# Patient Record
Sex: Female | Born: 1935
Health system: Southern US, Community
[De-identification: ages and names within clinical notes are randomized; demographics above are authoritative.]

---

## 1998-11-12 ENCOUNTER — Other Ambulatory Visit: Admission: RE | Admit: 1998-11-12 | Discharge: 1998-11-12 | Payer: Self-pay | Admitting: Obstetrics and Gynecology

## 1998-11-26 ENCOUNTER — Ambulatory Visit (HOSPITAL_COMMUNITY): Admission: RE | Admit: 1998-11-26 | Discharge: 1998-11-26 | Payer: Self-pay | Admitting: Obstetrics and Gynecology

## 1999-02-11 ENCOUNTER — Inpatient Hospital Stay (HOSPITAL_COMMUNITY): Admission: RE | Admit: 1999-02-11 | Discharge: 1999-02-12 | Payer: Self-pay | Admitting: Obstetrics and Gynecology

## 2002-01-11 ENCOUNTER — Other Ambulatory Visit: Admission: RE | Admit: 2002-01-11 | Discharge: 2002-01-11 | Payer: Self-pay | Admitting: Obstetrics and Gynecology

## 2003-01-17 ENCOUNTER — Other Ambulatory Visit: Admission: RE | Admit: 2003-01-17 | Discharge: 2003-01-17 | Payer: Self-pay | Admitting: Obstetrics and Gynecology

## 2009-03-06 IMAGING — US MAMMO-RUNI-US
1 series · 7 of 7 positions shown · non-contrast
Comparison: NONE

CLINICAL DATA: DENOVA.Olam RT(R)(M)(CT)   Diagnostic Mammogram.  
Question  developing subareolar density in the right breast noted 
on  screening mammogram dated 01/25/2008. 

RIGHT BREAST MAMMOGRAM ADDITIONAL VIEWS AND RIGHT BREAST 
ULTRASOUND

[Series 1: us breast · 0.05mm/px · 7 of 7 slices shown]
[im 1/7]
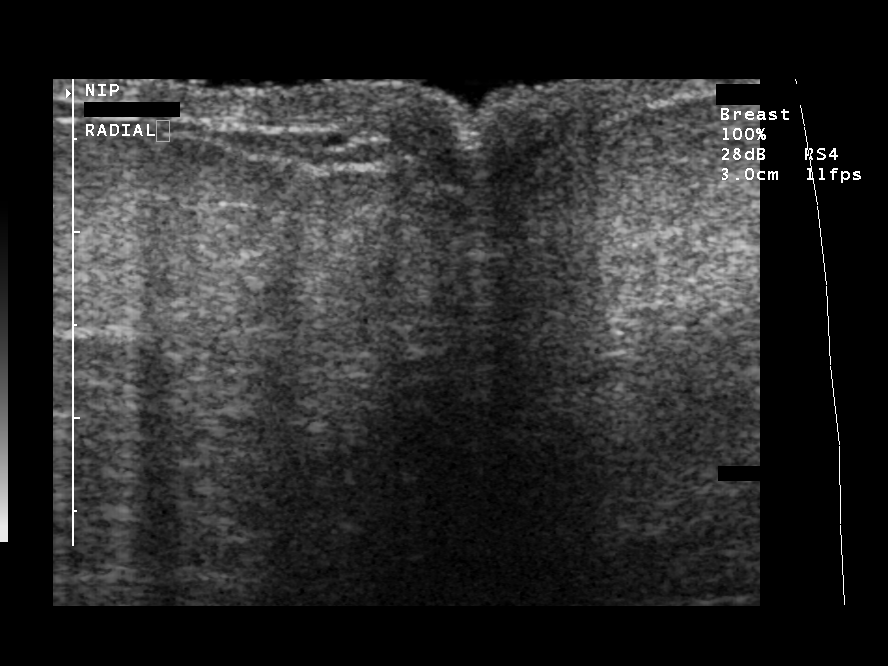
[im 2/7]
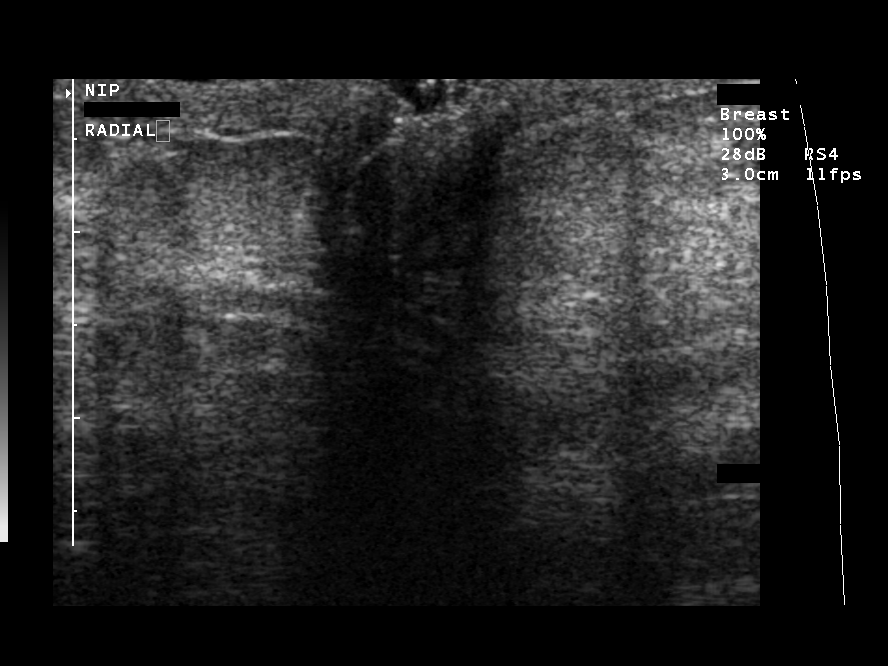
[im 3/7]
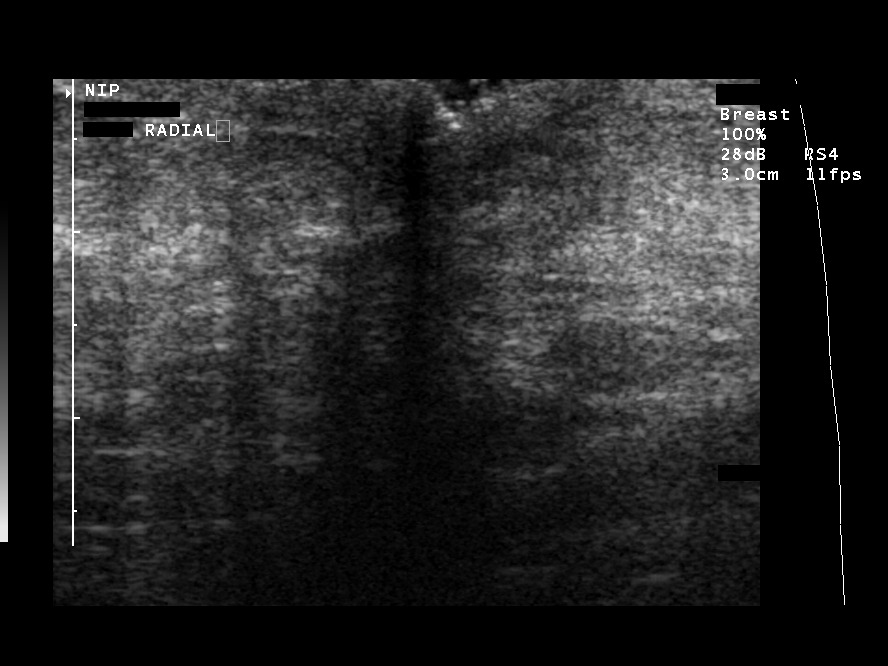
[im 4/7]
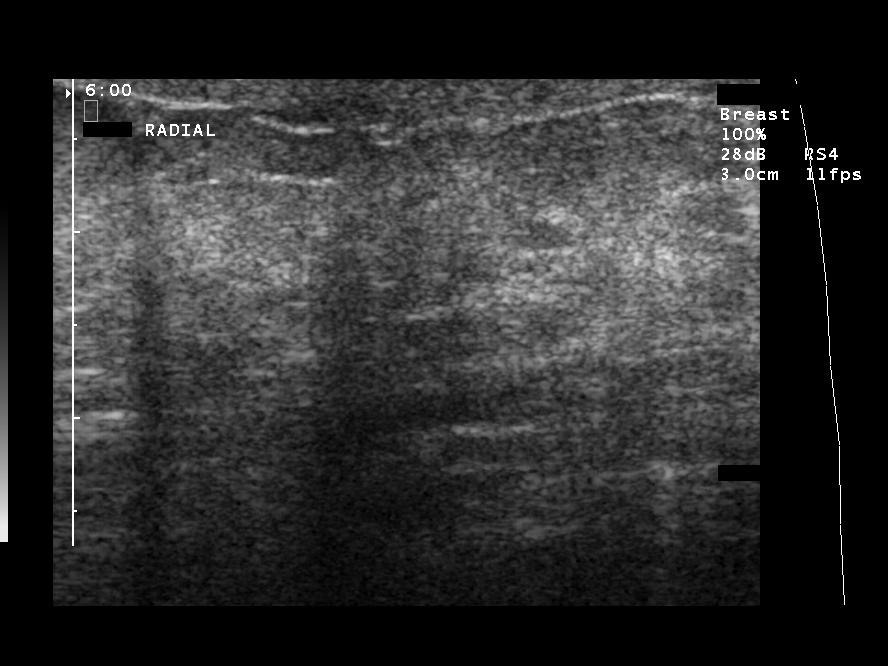
[im 5/7]
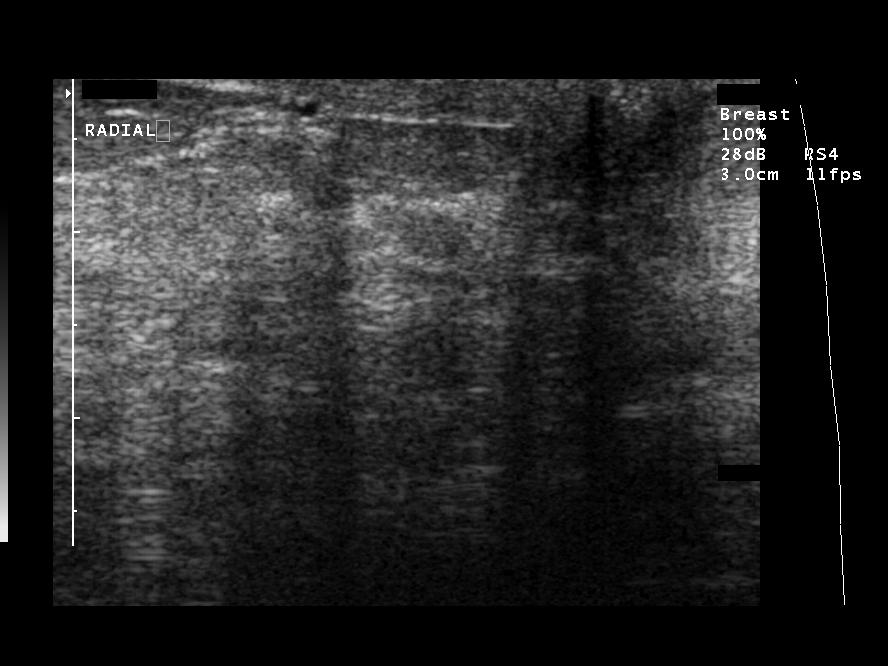
[im 6/7]
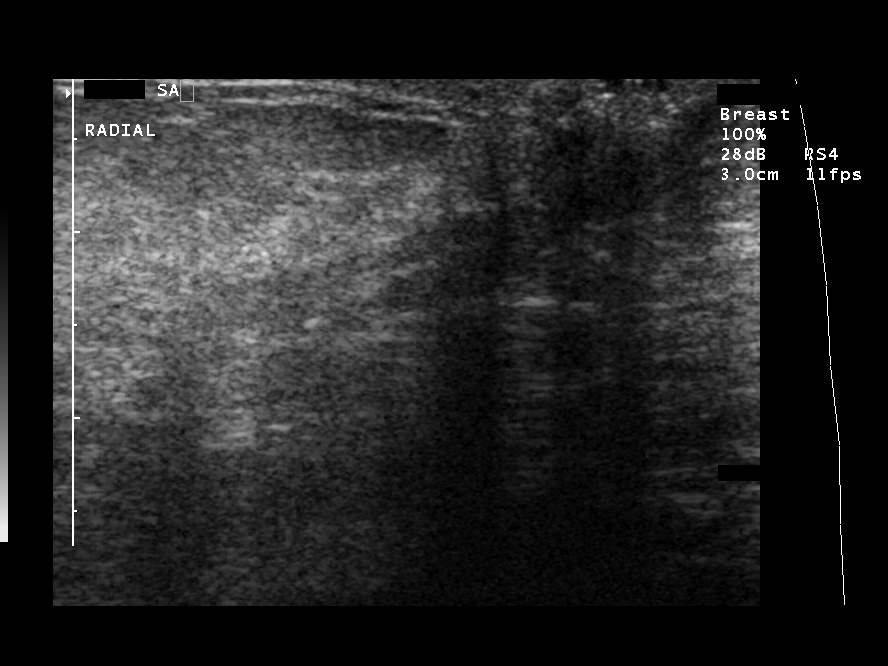
[im 7/7]
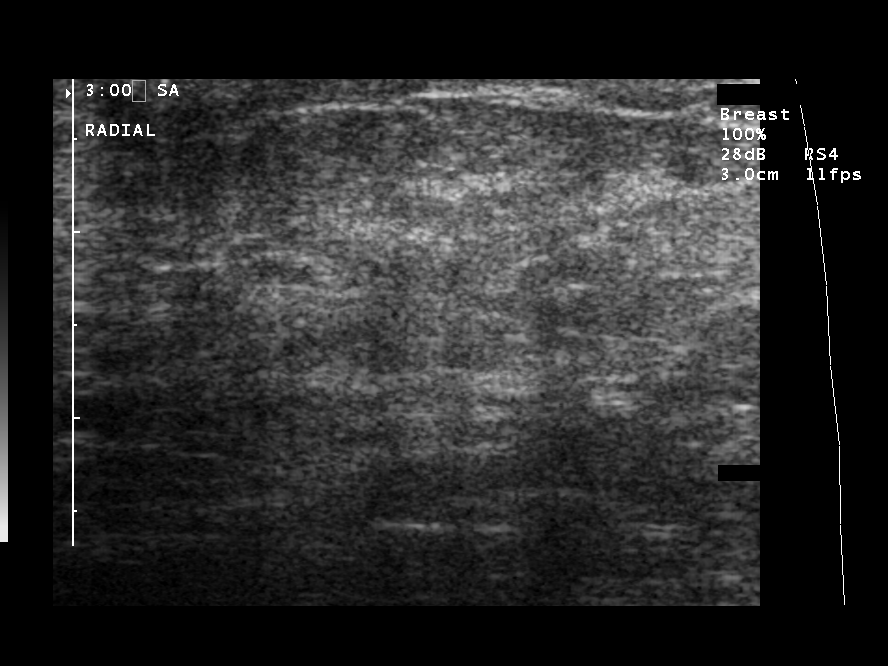

[7 of 7 positions shown; findings below may reference images not displayed]

FINDINGS: Spot compression view of the right breast in the CC 
and true lateral view demonstrates density likely associated with 
nipple inversion.  No architectural distortion, spiculation, mass 
or suspicious calcification.  Note is made of extensive scattered 
benign appearing calcifications in the right breast. Ultrasound 
demonstrates no cystic, solid or shadowing masses.
IMPRESSION: No mammographic or ultrasonographic finding 
suspicious for malignancy.  Bilateral mammogram in one year is 
recommended. The patient was informed at the time of the 
examination of the findings and recommendations by verbal and 
written lay report. Computer assisted (Second Look) technology was 
used as an aid in interpretation of this study. BI-RADS 2- Benign 
Date: 02/07/2008  Tran Date:  02/08/2008 INONK LAA

## 2013-09-18 ENCOUNTER — Other Ambulatory Visit: Payer: Self-pay | Admitting: Nephrology

## 2013-09-18 DIAGNOSIS — N189 Chronic kidney disease, unspecified: Secondary | ICD-10-CM

## 2013-09-25 ENCOUNTER — Ambulatory Visit
Admission: RE | Admit: 2013-09-25 | Discharge: 2013-09-25 | Disposition: A | Discharge status: Home / Self Care | Payer: Medicare Other | Source: Ambulatory Visit | Attending: Nephrology | Admitting: Nephrology

## 2013-09-25 DIAGNOSIS — N189 Chronic kidney disease, unspecified: Secondary | ICD-10-CM

## 2014-03-16 ENCOUNTER — Other Ambulatory Visit: Payer: Self-pay | Admitting: Obstetrics & Gynecology

## 2016-03-05 ENCOUNTER — Other Ambulatory Visit: Payer: Self-pay | Admitting: Family Medicine

## 2016-03-05 DIAGNOSIS — R0989 Other specified symptoms and signs involving the circulatory and respiratory systems: Secondary | ICD-10-CM

## 2016-03-11 ENCOUNTER — Ambulatory Visit
Admission: RE | Admit: 2016-03-11 | Discharge: 2016-03-11 | Disposition: A | Discharge status: Home / Self Care | Payer: Medicare Other | Source: Ambulatory Visit | Attending: Family Medicine | Admitting: Family Medicine

## 2016-03-11 DIAGNOSIS — R0989 Other specified symptoms and signs involving the circulatory and respiratory systems: Secondary | ICD-10-CM

## 2017-10-28 DIAGNOSIS — L900 Lichen sclerosus et atrophicus: Secondary | ICD-10-CM | POA: Diagnosis not present

## 2017-11-11 DIAGNOSIS — Z08 Encounter for follow-up examination after completed treatment for malignant neoplasm: Secondary | ICD-10-CM | POA: Diagnosis not present

## 2017-11-11 DIAGNOSIS — D1801 Hemangioma of skin and subcutaneous tissue: Secondary | ICD-10-CM | POA: Diagnosis not present

## 2017-11-11 DIAGNOSIS — Z85828 Personal history of other malignant neoplasm of skin: Secondary | ICD-10-CM | POA: Diagnosis not present

## 2017-12-03 ENCOUNTER — Other Ambulatory Visit: Payer: Self-pay | Admitting: Family Medicine

## 2017-12-03 DIAGNOSIS — R9389 Abnormal findings on diagnostic imaging of other specified body structures: Secondary | ICD-10-CM

## 2017-12-09 ENCOUNTER — Other Ambulatory Visit: Payer: Medicare Other

## 2017-12-10 ENCOUNTER — Ambulatory Visit
Admission: RE | Admit: 2017-12-10 | Discharge: 2017-12-10 | Disposition: A | Discharge status: Home / Self Care | Payer: Medicare Other | Source: Ambulatory Visit | Attending: Family Medicine | Admitting: Family Medicine

## 2017-12-10 DIAGNOSIS — I6523 Occlusion and stenosis of bilateral carotid arteries: Secondary | ICD-10-CM | POA: Diagnosis not present

## 2017-12-10 DIAGNOSIS — R9389 Abnormal findings on diagnostic imaging of other specified body structures: Secondary | ICD-10-CM

## 2018-02-25 DIAGNOSIS — Z6831 Body mass index (BMI) 31.0-31.9, adult: Secondary | ICD-10-CM | POA: Diagnosis not present

## 2018-02-25 DIAGNOSIS — I129 Hypertensive chronic kidney disease with stage 1 through stage 4 chronic kidney disease, or unspecified chronic kidney disease: Secondary | ICD-10-CM | POA: Diagnosis not present

## 2018-02-25 DIAGNOSIS — N183 Chronic kidney disease, stage 3 (moderate): Secondary | ICD-10-CM | POA: Diagnosis not present

## 2018-02-25 DIAGNOSIS — R809 Proteinuria, unspecified: Secondary | ICD-10-CM | POA: Diagnosis not present

## 2018-03-04 IMAGING — US US CAROTID DUPLEX BILAT
1 series · 13 of 24 positions shown · non-contrast
Comparison: 03/11/2016

CLINICAL DATA: 89-year-old female with a history of
atherosclerosis.

Cardiovascular risk factors include hypertension, hyperlipidemia,
diabetes
EXAM:
BILATERAL CAROTID DUPLEX ULTRASOUND
TECHNIQUE: Gray scale imaging, color Doppler and duplex ultrasound were
performed of bilateral carotid and vertebral arteries in the neck.

[Series 1: us carotid duplex bilat · 0.06mm/px · 13 of 46 slices shown]
[im 1/46]
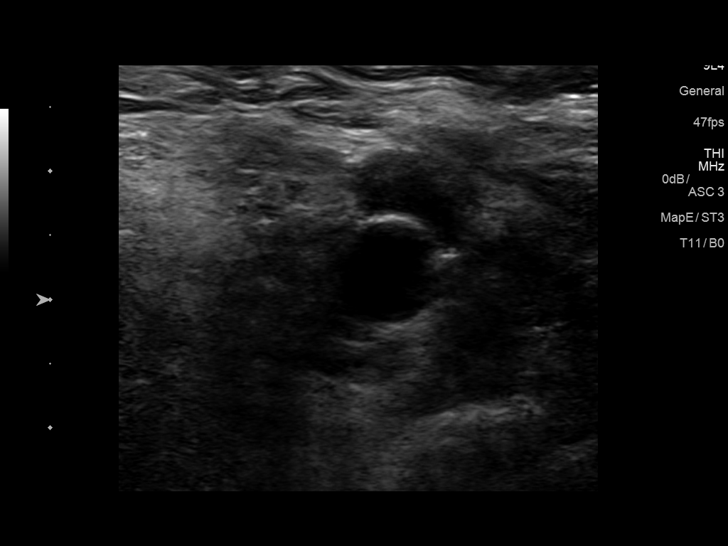
[im 4/46]
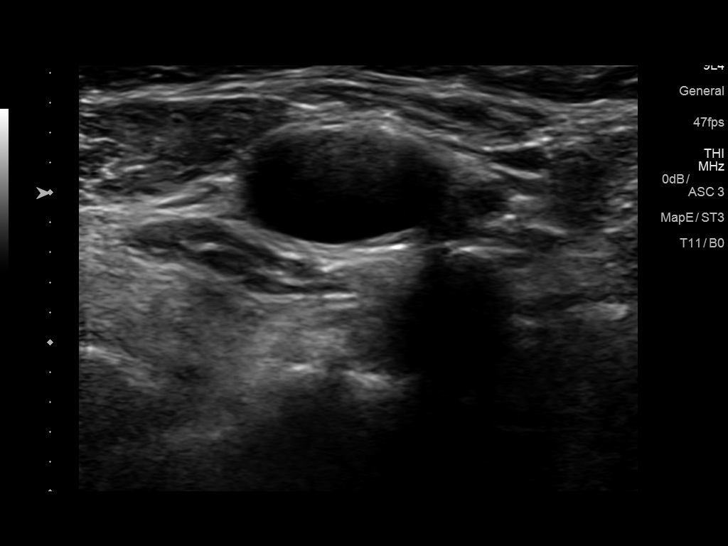
[im 8/46]
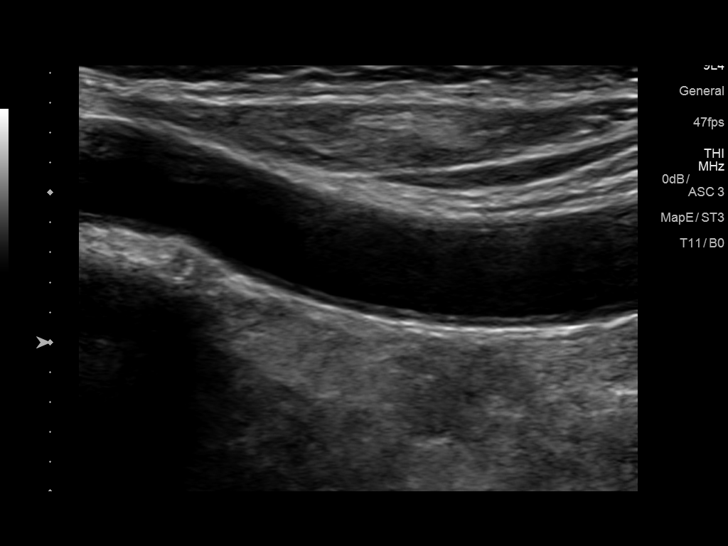
[im 12/46]
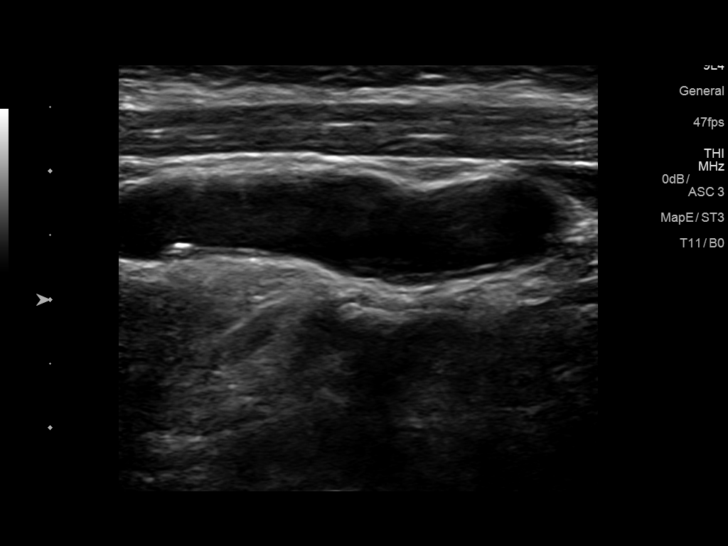
[im 16/46]
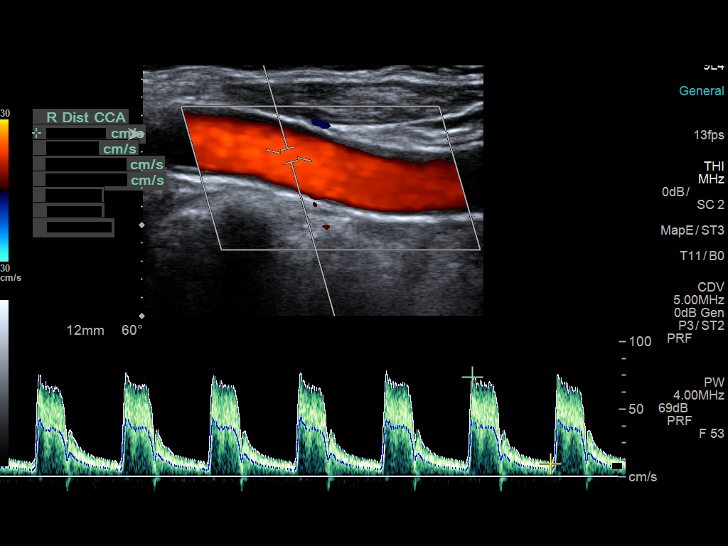
[im 20/46]
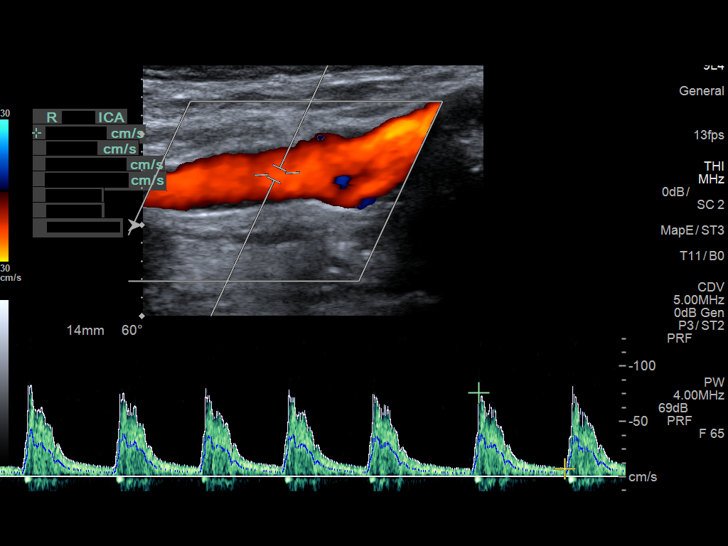
[im 24/46]
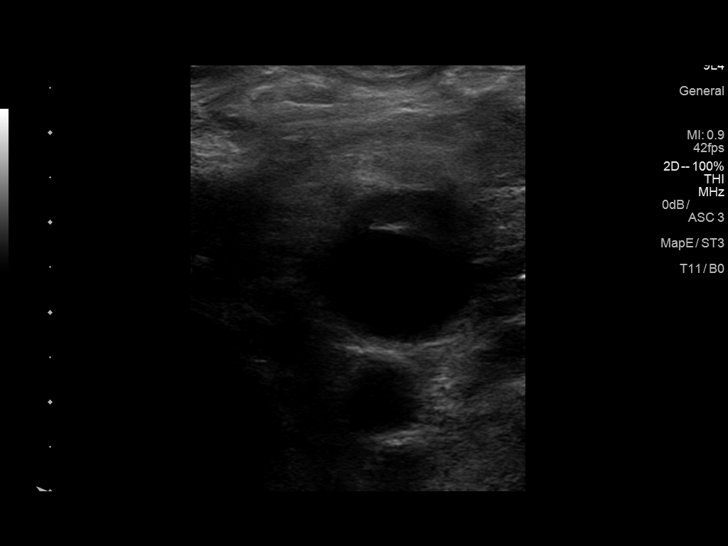
[im 26/46]
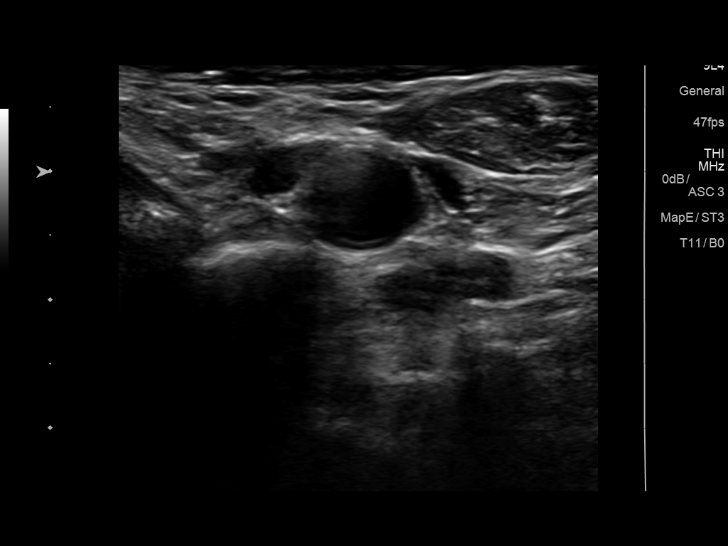
[im 30/46]
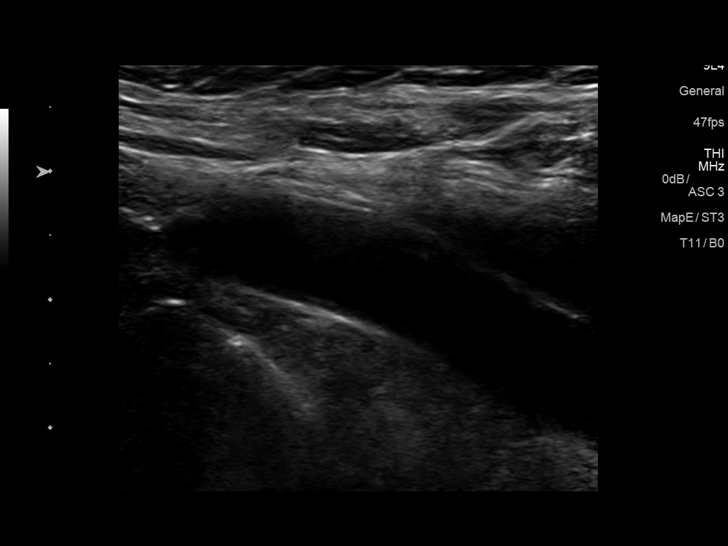
[im 34/46]
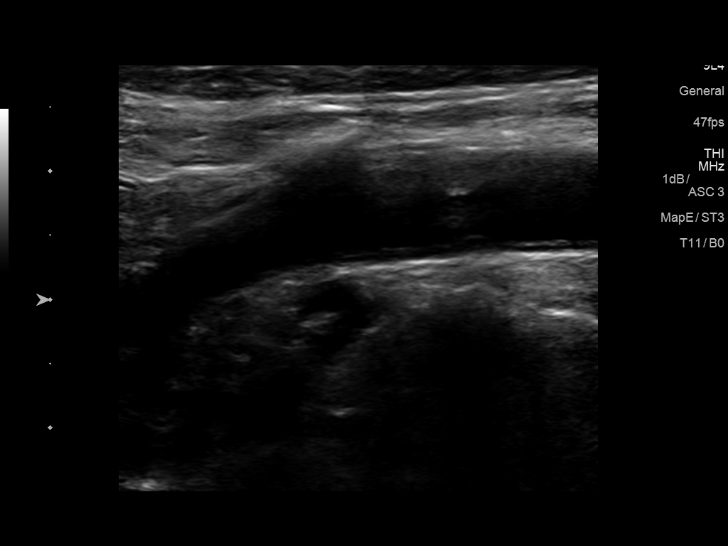
[im 38/46]
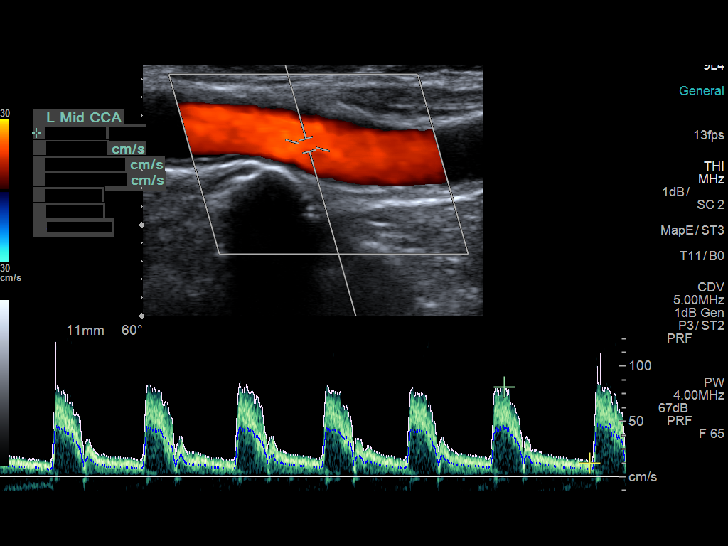
[im 42/46]
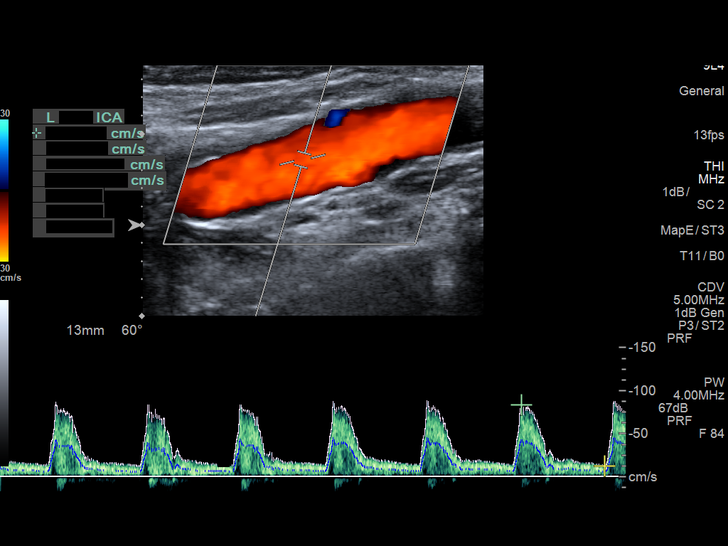
[im 46/46]
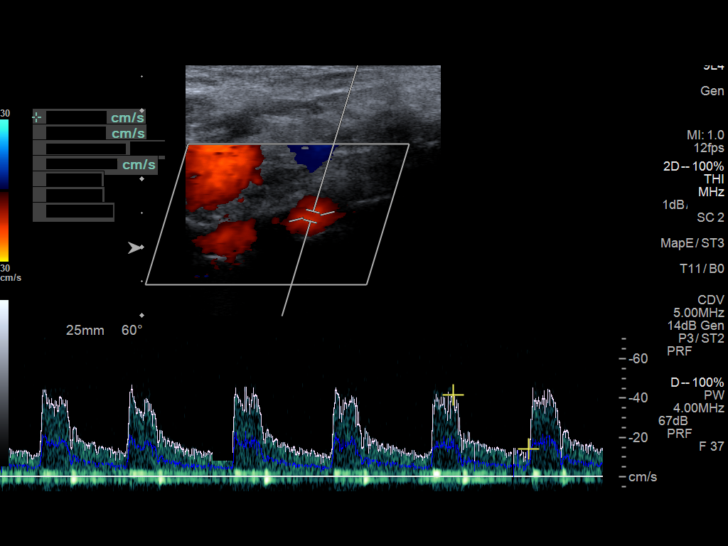

[13 of 24 positions shown; findings below may reference images not displayed]

FINDINGS: Criteria: Quantification of carotid stenosis is based on velocity
parameters that correlate the residual internal carotid diameter
with NASCET-based stenosis levels, using the diameter of the distal
internal carotid lumen as the denominator for stenosis measurement.

The following velocity measurements were obtained:

RIGHT

ICA:  Systolic 123 cm/sec, Diastolic 17 cm/sec

CCA:  74 cm/sec

SYSTOLIC ICA/CCA RATIO:

ECA:  96 cm/sec

LEFT

ICA:  Systolic 87 cm/sec, Diastolic 21 cm/sec

CCA:  100 cm/sec

SYSTOLIC ICA/CCA RATIO:

ECA:  100 cm/sec

Right Brachial SBP: 195

Left Brachial SBP: Not acquired

RIGHT CAROTID ARTERY: No significant calcifications of the right
common carotid artery. Intermediate waveform maintained.
Heterogeneous and partially calcified plaque at the right carotid
bifurcation. No significant lumen shadowing. Low resistance waveform
of the right ICA. No significant tortuosity.

RIGHT VERTEBRAL ARTERY: Antegrade flow with low resistance waveform.

LEFT CAROTID ARTERY: No significant calcifications of the left
common carotid artery. Intermediate waveform maintained.
Heterogeneous and partially calcified plaque at the left carotid
bifurcation without significant lumen shadowing. Low resistance
waveform of the left ICA. No significant tortuosity.

LEFT VERTEBRAL ARTERY:  Antegrade flow with low resistance waveform.
IMPRESSION: Color duplex indicates minimal heterogeneous and calcified plaque,
with no hemodynamically significant stenosis by duplex criteria in
the extracranial cerebrovascular circulation.

## 2018-03-22 DIAGNOSIS — E119 Type 2 diabetes mellitus without complications: Secondary | ICD-10-CM | POA: Diagnosis not present

## 2018-03-22 DIAGNOSIS — Z853 Personal history of malignant neoplasm of breast: Secondary | ICD-10-CM | POA: Diagnosis not present

## 2018-03-22 DIAGNOSIS — Z1389 Encounter for screening for other disorder: Secondary | ICD-10-CM | POA: Diagnosis not present

## 2018-03-22 DIAGNOSIS — E039 Hypothyroidism, unspecified: Secondary | ICD-10-CM | POA: Diagnosis not present

## 2018-03-22 DIAGNOSIS — E782 Mixed hyperlipidemia: Secondary | ICD-10-CM | POA: Diagnosis not present

## 2018-03-22 DIAGNOSIS — Z0000 Encounter for general adult medical examination without abnormal findings: Secondary | ICD-10-CM | POA: Diagnosis not present

## 2018-03-22 DIAGNOSIS — I10 Essential (primary) hypertension: Secondary | ICD-10-CM | POA: Diagnosis not present

## 2018-03-22 DIAGNOSIS — E559 Vitamin D deficiency, unspecified: Secondary | ICD-10-CM | POA: Diagnosis not present

## 2018-03-22 DIAGNOSIS — E2839 Other primary ovarian failure: Secondary | ICD-10-CM | POA: Diagnosis not present

## 2018-03-22 DIAGNOSIS — Z9010 Acquired absence of unspecified breast and nipple: Secondary | ICD-10-CM | POA: Diagnosis not present

## 2018-03-22 DIAGNOSIS — N183 Chronic kidney disease, stage 3 (moderate): Secondary | ICD-10-CM | POA: Diagnosis not present

## 2018-03-22 DIAGNOSIS — R809 Proteinuria, unspecified: Secondary | ICD-10-CM | POA: Diagnosis not present

## 2018-03-29 DIAGNOSIS — Z7984 Long term (current) use of oral hypoglycemic drugs: Secondary | ICD-10-CM | POA: Diagnosis not present

## 2018-03-29 DIAGNOSIS — E119 Type 2 diabetes mellitus without complications: Secondary | ICD-10-CM | POA: Diagnosis not present

## 2018-04-04 DIAGNOSIS — E119 Type 2 diabetes mellitus without complications: Secondary | ICD-10-CM | POA: Diagnosis not present

## 2018-04-04 DIAGNOSIS — H353132 Nonexudative age-related macular degeneration, bilateral, intermediate dry stage: Secondary | ICD-10-CM | POA: Diagnosis not present

## 2018-05-03 DIAGNOSIS — M8588 Other specified disorders of bone density and structure, other site: Secondary | ICD-10-CM | POA: Diagnosis not present

## 2018-05-26 DIAGNOSIS — Z85828 Personal history of other malignant neoplasm of skin: Secondary | ICD-10-CM | POA: Diagnosis not present

## 2018-05-26 DIAGNOSIS — Z08 Encounter for follow-up examination after completed treatment for malignant neoplasm: Secondary | ICD-10-CM | POA: Diagnosis not present

## 2018-05-26 DIAGNOSIS — L570 Actinic keratosis: Secondary | ICD-10-CM | POA: Diagnosis not present

## 2018-05-26 DIAGNOSIS — X32XXXD Exposure to sunlight, subsequent encounter: Secondary | ICD-10-CM | POA: Diagnosis not present

## 2018-05-26 DIAGNOSIS — D225 Melanocytic nevi of trunk: Secondary | ICD-10-CM | POA: Diagnosis not present

## 2018-05-27 ENCOUNTER — Other Ambulatory Visit: Payer: Self-pay | Admitting: *Deleted

## 2018-05-27 NOTE — Patient Outreach (Signed)
[facility]) Care Management  05/26/2018  Stacy Valentine 09/23/1926 [id number]    RN attempted the initial outreach however unsuccessful. Will continue calls and send outreach letter accordingly for possible needs.  Raina Mina, RN Care Management Coordinator [place] Office [phone]

## 2018-06-08 ENCOUNTER — Other Ambulatory Visit: Payer: Self-pay | Admitting: *Deleted

## 2018-06-08 NOTE — Patient Outreach (Signed)
[facility]) Care Management  06/08/2018  Stacy Valentine [birth year] [id number]    HTA/HRA Closure  RN attempted outreach calls X three however unsuccessful with outreach letter. Case will be closed due to unsuccessful contacts.  Raina Mina, RN Care Management Coordinator [place] Office [phone]

## 2018-08-01 DIAGNOSIS — Z23 Encounter for immunization: Secondary | ICD-10-CM | POA: Diagnosis not present

## 2018-09-26 DIAGNOSIS — E11319 Type 2 diabetes mellitus with unspecified diabetic retinopathy without macular edema: Secondary | ICD-10-CM | POA: Diagnosis not present

## 2018-09-26 DIAGNOSIS — E039 Hypothyroidism, unspecified: Secondary | ICD-10-CM | POA: Diagnosis not present

## 2018-09-26 DIAGNOSIS — I10 Essential (primary) hypertension: Secondary | ICD-10-CM | POA: Diagnosis not present

## 2018-09-26 DIAGNOSIS — N183 Chronic kidney disease, stage 3 (moderate): Secondary | ICD-10-CM | POA: Diagnosis not present

## 2018-09-26 DIAGNOSIS — E782 Mixed hyperlipidemia: Secondary | ICD-10-CM | POA: Diagnosis not present

## 2018-09-26 DIAGNOSIS — R809 Proteinuria, unspecified: Secondary | ICD-10-CM | POA: Diagnosis not present

## 2018-09-26 DIAGNOSIS — Z7984 Long term (current) use of oral hypoglycemic drugs: Secondary | ICD-10-CM | POA: Diagnosis not present

## 2018-09-26 DIAGNOSIS — E559 Vitamin D deficiency, unspecified: Secondary | ICD-10-CM | POA: Diagnosis not present

## 2018-09-26 DIAGNOSIS — E1122 Type 2 diabetes mellitus with diabetic chronic kidney disease: Secondary | ICD-10-CM | POA: Diagnosis not present

## 2018-10-10 DIAGNOSIS — E878 Other disorders of electrolyte and fluid balance, not elsewhere classified: Secondary | ICD-10-CM | POA: Diagnosis not present

## 2018-11-22 DIAGNOSIS — I129 Hypertensive chronic kidney disease with stage 1 through stage 4 chronic kidney disease, or unspecified chronic kidney disease: Secondary | ICD-10-CM | POA: Diagnosis not present

## 2018-11-22 DIAGNOSIS — N183 Chronic kidney disease, stage 3 (moderate): Secondary | ICD-10-CM | POA: Diagnosis not present

## 2018-11-22 DIAGNOSIS — R809 Proteinuria, unspecified: Secondary | ICD-10-CM | POA: Diagnosis not present

## 2018-11-22 DIAGNOSIS — E1029 Type 1 diabetes mellitus with other diabetic kidney complication: Secondary | ICD-10-CM | POA: Diagnosis not present

## 2018-12-01 DIAGNOSIS — Z08 Encounter for follow-up examination after completed treatment for malignant neoplasm: Secondary | ICD-10-CM | POA: Diagnosis not present

## 2018-12-01 DIAGNOSIS — Z85828 Personal history of other malignant neoplasm of skin: Secondary | ICD-10-CM | POA: Diagnosis not present

## 2018-12-01 DIAGNOSIS — X32XXXD Exposure to sunlight, subsequent encounter: Secondary | ICD-10-CM | POA: Diagnosis not present

## 2018-12-01 DIAGNOSIS — L570 Actinic keratosis: Secondary | ICD-10-CM | POA: Diagnosis not present

## 2018-12-22 DIAGNOSIS — L900 Lichen sclerosus et atrophicus: Secondary | ICD-10-CM | POA: Diagnosis not present

## 2019-01-03 DIAGNOSIS — I10 Essential (primary) hypertension: Secondary | ICD-10-CM | POA: Diagnosis not present

## 2019-01-03 DIAGNOSIS — E11319 Type 2 diabetes mellitus with unspecified diabetic retinopathy without macular edema: Secondary | ICD-10-CM | POA: Diagnosis not present

## 2019-01-03 DIAGNOSIS — E782 Mixed hyperlipidemia: Secondary | ICD-10-CM | POA: Diagnosis not present

## 2019-01-03 DIAGNOSIS — N183 Chronic kidney disease, stage 3 (moderate): Secondary | ICD-10-CM | POA: Diagnosis not present

## 2019-01-03 DIAGNOSIS — Z853 Personal history of malignant neoplasm of breast: Secondary | ICD-10-CM | POA: Diagnosis not present

## 2019-01-03 DIAGNOSIS — E039 Hypothyroidism, unspecified: Secondary | ICD-10-CM | POA: Diagnosis not present

## 2019-01-03 DIAGNOSIS — E1122 Type 2 diabetes mellitus with diabetic chronic kidney disease: Secondary | ICD-10-CM | POA: Diagnosis not present

## 2019-03-31 DIAGNOSIS — E559 Vitamin D deficiency, unspecified: Secondary | ICD-10-CM | POA: Diagnosis not present

## 2019-03-31 DIAGNOSIS — N183 Chronic kidney disease, stage 3 (moderate): Secondary | ICD-10-CM | POA: Diagnosis not present

## 2019-03-31 DIAGNOSIS — E039 Hypothyroidism, unspecified: Secondary | ICD-10-CM | POA: Diagnosis not present

## 2019-03-31 DIAGNOSIS — E782 Mixed hyperlipidemia: Secondary | ICD-10-CM | POA: Diagnosis not present

## 2019-03-31 DIAGNOSIS — Z6828 Body mass index (BMI) 28.0-28.9, adult: Secondary | ICD-10-CM | POA: Diagnosis not present

## 2019-03-31 DIAGNOSIS — E1122 Type 2 diabetes mellitus with diabetic chronic kidney disease: Secondary | ICD-10-CM | POA: Diagnosis not present

## 2019-03-31 DIAGNOSIS — Z0000 Encounter for general adult medical examination without abnormal findings: Secondary | ICD-10-CM | POA: Diagnosis not present

## 2019-03-31 DIAGNOSIS — M25511 Pain in right shoulder: Secondary | ICD-10-CM | POA: Diagnosis not present

## 2019-04-07 DIAGNOSIS — R809 Proteinuria, unspecified: Secondary | ICD-10-CM | POA: Diagnosis not present

## 2019-04-07 DIAGNOSIS — R634 Abnormal weight loss: Secondary | ICD-10-CM | POA: Diagnosis not present

## 2019-04-07 DIAGNOSIS — I10 Essential (primary) hypertension: Secondary | ICD-10-CM | POA: Diagnosis not present

## 2019-04-07 DIAGNOSIS — E1122 Type 2 diabetes mellitus with diabetic chronic kidney disease: Secondary | ICD-10-CM | POA: Diagnosis not present

## 2019-04-07 DIAGNOSIS — E039 Hypothyroidism, unspecified: Secondary | ICD-10-CM | POA: Diagnosis not present

## 2019-04-07 DIAGNOSIS — N183 Chronic kidney disease, stage 3 (moderate): Secondary | ICD-10-CM | POA: Diagnosis not present

## 2019-04-07 DIAGNOSIS — E782 Mixed hyperlipidemia: Secondary | ICD-10-CM | POA: Diagnosis not present

## 2019-04-07 DIAGNOSIS — Z7984 Long term (current) use of oral hypoglycemic drugs: Secondary | ICD-10-CM | POA: Diagnosis not present

## 2019-07-12 DIAGNOSIS — N2581 Secondary hyperparathyroidism of renal origin: Secondary | ICD-10-CM | POA: Diagnosis not present

## 2019-07-12 DIAGNOSIS — N183 Chronic kidney disease, stage 3 (moderate): Secondary | ICD-10-CM | POA: Diagnosis not present

## 2019-07-12 DIAGNOSIS — E1029 Type 1 diabetes mellitus with other diabetic kidney complication: Secondary | ICD-10-CM | POA: Diagnosis not present

## 2019-07-12 DIAGNOSIS — I129 Hypertensive chronic kidney disease with stage 1 through stage 4 chronic kidney disease, or unspecified chronic kidney disease: Secondary | ICD-10-CM | POA: Diagnosis not present

## 2019-07-12 DIAGNOSIS — R809 Proteinuria, unspecified: Secondary | ICD-10-CM | POA: Diagnosis not present

## 2019-07-13 DIAGNOSIS — Z85828 Personal history of other malignant neoplasm of skin: Secondary | ICD-10-CM | POA: Diagnosis not present

## 2019-07-13 DIAGNOSIS — Z08 Encounter for follow-up examination after completed treatment for malignant neoplasm: Secondary | ICD-10-CM | POA: Diagnosis not present

## 2019-07-13 DIAGNOSIS — B078 Other viral warts: Secondary | ICD-10-CM | POA: Diagnosis not present

## 2019-07-26 DIAGNOSIS — E1122 Type 2 diabetes mellitus with diabetic chronic kidney disease: Secondary | ICD-10-CM | POA: Diagnosis not present

## 2019-07-26 DIAGNOSIS — E11319 Type 2 diabetes mellitus with unspecified diabetic retinopathy without macular edema: Secondary | ICD-10-CM | POA: Diagnosis not present

## 2019-07-26 DIAGNOSIS — E039 Hypothyroidism, unspecified: Secondary | ICD-10-CM | POA: Diagnosis not present

## 2019-07-26 DIAGNOSIS — Z853 Personal history of malignant neoplasm of breast: Secondary | ICD-10-CM | POA: Diagnosis not present

## 2019-07-26 DIAGNOSIS — N183 Chronic kidney disease, stage 3 (moderate): Secondary | ICD-10-CM | POA: Diagnosis not present

## 2019-07-26 DIAGNOSIS — E782 Mixed hyperlipidemia: Secondary | ICD-10-CM | POA: Diagnosis not present

## 2019-07-26 DIAGNOSIS — I10 Essential (primary) hypertension: Secondary | ICD-10-CM | POA: Diagnosis not present

## 2019-08-15 DIAGNOSIS — Z23 Encounter for immunization: Secondary | ICD-10-CM | POA: Diagnosis not present

## 2019-10-09 DIAGNOSIS — N183 Chronic kidney disease, stage 3 unspecified: Secondary | ICD-10-CM | POA: Diagnosis not present

## 2019-10-09 DIAGNOSIS — E782 Mixed hyperlipidemia: Secondary | ICD-10-CM | POA: Diagnosis not present

## 2019-10-09 DIAGNOSIS — R809 Proteinuria, unspecified: Secondary | ICD-10-CM | POA: Diagnosis not present

## 2019-10-09 DIAGNOSIS — E039 Hypothyroidism, unspecified: Secondary | ICD-10-CM | POA: Diagnosis not present

## 2019-10-09 DIAGNOSIS — E11319 Type 2 diabetes mellitus with unspecified diabetic retinopathy without macular edema: Secondary | ICD-10-CM | POA: Diagnosis not present

## 2019-10-09 DIAGNOSIS — E1122 Type 2 diabetes mellitus with diabetic chronic kidney disease: Secondary | ICD-10-CM | POA: Diagnosis not present

## 2019-10-09 DIAGNOSIS — Z7984 Long term (current) use of oral hypoglycemic drugs: Secondary | ICD-10-CM | POA: Diagnosis not present

## 2019-10-09 DIAGNOSIS — I10 Essential (primary) hypertension: Secondary | ICD-10-CM | POA: Diagnosis not present

## 2019-10-09 DIAGNOSIS — E559 Vitamin D deficiency, unspecified: Secondary | ICD-10-CM | POA: Diagnosis not present

## 2019-10-25 DIAGNOSIS — E1122 Type 2 diabetes mellitus with diabetic chronic kidney disease: Secondary | ICD-10-CM | POA: Diagnosis not present

## 2019-10-25 DIAGNOSIS — Z7984 Long term (current) use of oral hypoglycemic drugs: Secondary | ICD-10-CM | POA: Diagnosis not present

## 2019-10-25 DIAGNOSIS — I10 Essential (primary) hypertension: Secondary | ICD-10-CM | POA: Diagnosis not present

## 2019-10-25 DIAGNOSIS — E039 Hypothyroidism, unspecified: Secondary | ICD-10-CM | POA: Diagnosis not present

## 2019-10-25 DIAGNOSIS — E11319 Type 2 diabetes mellitus with unspecified diabetic retinopathy without macular edema: Secondary | ICD-10-CM | POA: Diagnosis not present

## 2019-10-25 DIAGNOSIS — E559 Vitamin D deficiency, unspecified: Secondary | ICD-10-CM | POA: Diagnosis not present

## 2019-10-25 DIAGNOSIS — R809 Proteinuria, unspecified: Secondary | ICD-10-CM | POA: Diagnosis not present

## 2019-10-25 DIAGNOSIS — Z853 Personal history of malignant neoplasm of breast: Secondary | ICD-10-CM | POA: Diagnosis not present

## 2019-10-25 DIAGNOSIS — E782 Mixed hyperlipidemia: Secondary | ICD-10-CM | POA: Diagnosis not present

## 2019-10-25 DIAGNOSIS — N183 Chronic kidney disease, stage 3 unspecified: Secondary | ICD-10-CM | POA: Diagnosis not present

## 2019-11-09 DIAGNOSIS — B078 Other viral warts: Secondary | ICD-10-CM | POA: Diagnosis not present

## 2019-11-09 DIAGNOSIS — L820 Inflamed seborrheic keratosis: Secondary | ICD-10-CM | POA: Diagnosis not present

## 2019-11-25 ENCOUNTER — Ambulatory Visit: Payer: PPO

## 2019-11-30 ENCOUNTER — Ambulatory Visit: Payer: PPO

## 2019-11-30 ENCOUNTER — Ambulatory Visit: Payer: PPO | Attending: Internal Medicine

## 2019-11-30 DIAGNOSIS — Z23 Encounter for immunization: Secondary | ICD-10-CM

## 2019-11-30 NOTE — Progress Notes (Signed)
   Covid-19 Vaccination Clinic  Name:  Stacy Valentine    MRN: [id number] DOB: 09-07-1926  11/30/2019  Ms. Stacy Valentine was observed post Covid-19 immunization for 15 minutes without incidence. She was provided with Vaccine Information Sheet and instruction to access the V-Safe system.   Ms. Stacy Valentine was instructed to call 911 with any severe reactions post vaccine: Marland Kitchen Difficulty breathing  . Swelling of your face and throat  . A fast heartbeat  . A bad rash all over your body  . Dizziness and weakness    Immunizations Administered    Name Date Dose VIS Date Route   Pfizer COVID-19 Vaccine 11/30/2019  9:27 AM 0.3 mL 10/06/2019 Intramuscular   Manufacturer: [place]   Lot: [id number]   [place]: [id number]

## 2019-12-25 ENCOUNTER — Ambulatory Visit: Payer: PPO | Attending: Internal Medicine

## 2019-12-25 DIAGNOSIS — Z23 Encounter for immunization: Secondary | ICD-10-CM | POA: Insufficient documentation

## 2019-12-25 NOTE — Progress Notes (Signed)
   Covid-19 Vaccination Clinic  Name:  Stacy Valentine    MRN: [id number] DOB: 23-Oct-1926  12/25/2019  [name] was observed post Covid-19 immunization for 15 minutes without incidence. She was provided with Vaccine Information Sheet and instruction to access the V-Safe system.   [name] was instructed to call 911 with any severe reactions post vaccine: Marland Kitchen Difficulty breathing  . Swelling of your face and throat  . A fast heartbeat  . A bad rash all over your body  . Dizziness and weakness    Immunizations Administered    Name Date Dose VIS Date Route   Pfizer COVID-19 Vaccine 12/25/2019  9:07 AM 0.3 mL 10/06/2019 Intramuscular   Manufacturer: [place]   Lot: [id number]   [place]: [id number]

## 2019-12-26 DIAGNOSIS — N952 Postmenopausal atrophic vaginitis: Secondary | ICD-10-CM | POA: Diagnosis not present

## 2019-12-26 DIAGNOSIS — L900 Lichen sclerosus et atrophicus: Secondary | ICD-10-CM | POA: Diagnosis not present

## 2020-01-09 DIAGNOSIS — E1029 Type 1 diabetes mellitus with other diabetic kidney complication: Secondary | ICD-10-CM | POA: Diagnosis not present

## 2020-01-09 DIAGNOSIS — R809 Proteinuria, unspecified: Secondary | ICD-10-CM | POA: Diagnosis not present

## 2020-01-09 DIAGNOSIS — N2581 Secondary hyperparathyroidism of renal origin: Secondary | ICD-10-CM | POA: Diagnosis not present

## 2020-01-09 DIAGNOSIS — E039 Hypothyroidism, unspecified: Secondary | ICD-10-CM | POA: Diagnosis not present

## 2020-01-09 DIAGNOSIS — I129 Hypertensive chronic kidney disease with stage 1 through stage 4 chronic kidney disease, or unspecified chronic kidney disease: Secondary | ICD-10-CM | POA: Diagnosis not present

## 2020-01-09 DIAGNOSIS — N183 Chronic kidney disease, stage 3 unspecified: Secondary | ICD-10-CM | POA: Diagnosis not present

## 2020-02-09 DIAGNOSIS — H5212 Myopia, left eye: Secondary | ICD-10-CM | POA: Diagnosis not present

## 2020-02-09 DIAGNOSIS — H353133 Nonexudative age-related macular degeneration, bilateral, advanced atrophic without subfoveal involvement: Secondary | ICD-10-CM | POA: Diagnosis not present

## 2020-02-09 DIAGNOSIS — E119 Type 2 diabetes mellitus without complications: Secondary | ICD-10-CM | POA: Diagnosis not present

## 2020-02-09 DIAGNOSIS — H5201 Hypermetropia, right eye: Secondary | ICD-10-CM | POA: Diagnosis not present

## 2020-02-15 DIAGNOSIS — N183 Chronic kidney disease, stage 3 unspecified: Secondary | ICD-10-CM | POA: Diagnosis not present

## 2020-02-15 DIAGNOSIS — E039 Hypothyroidism, unspecified: Secondary | ICD-10-CM | POA: Diagnosis not present

## 2020-02-15 DIAGNOSIS — E11319 Type 2 diabetes mellitus with unspecified diabetic retinopathy without macular edema: Secondary | ICD-10-CM | POA: Diagnosis not present

## 2020-02-15 DIAGNOSIS — Z853 Personal history of malignant neoplasm of breast: Secondary | ICD-10-CM | POA: Diagnosis not present

## 2020-02-15 DIAGNOSIS — E782 Mixed hyperlipidemia: Secondary | ICD-10-CM | POA: Diagnosis not present

## 2020-02-15 DIAGNOSIS — I10 Essential (primary) hypertension: Secondary | ICD-10-CM | POA: Diagnosis not present

## 2020-02-15 DIAGNOSIS — E1122 Type 2 diabetes mellitus with diabetic chronic kidney disease: Secondary | ICD-10-CM | POA: Diagnosis not present

## 2020-04-02 DIAGNOSIS — I10 Essential (primary) hypertension: Secondary | ICD-10-CM | POA: Diagnosis not present

## 2020-04-02 DIAGNOSIS — E039 Hypothyroidism, unspecified: Secondary | ICD-10-CM | POA: Diagnosis not present

## 2020-04-02 DIAGNOSIS — Z853 Personal history of malignant neoplasm of breast: Secondary | ICD-10-CM | POA: Diagnosis not present

## 2020-04-02 DIAGNOSIS — E782 Mixed hyperlipidemia: Secondary | ICD-10-CM | POA: Diagnosis not present

## 2020-04-02 DIAGNOSIS — E11319 Type 2 diabetes mellitus with unspecified diabetic retinopathy without macular edema: Secondary | ICD-10-CM | POA: Diagnosis not present

## 2020-04-02 DIAGNOSIS — N183 Chronic kidney disease, stage 3 unspecified: Secondary | ICD-10-CM | POA: Diagnosis not present

## 2020-04-02 DIAGNOSIS — E1122 Type 2 diabetes mellitus with diabetic chronic kidney disease: Secondary | ICD-10-CM | POA: Diagnosis not present

## 2020-04-02 DIAGNOSIS — M810 Age-related osteoporosis without current pathological fracture: Secondary | ICD-10-CM | POA: Diagnosis not present

## 2020-04-16 DIAGNOSIS — M810 Age-related osteoporosis without current pathological fracture: Secondary | ICD-10-CM | POA: Diagnosis not present

## 2020-04-16 DIAGNOSIS — E559 Vitamin D deficiency, unspecified: Secondary | ICD-10-CM | POA: Diagnosis not present

## 2020-04-16 DIAGNOSIS — Z85828 Personal history of other malignant neoplasm of skin: Secondary | ICD-10-CM | POA: Diagnosis not present

## 2020-04-16 DIAGNOSIS — Z0000 Encounter for general adult medical examination without abnormal findings: Secondary | ICD-10-CM | POA: Diagnosis not present

## 2020-04-16 DIAGNOSIS — E782 Mixed hyperlipidemia: Secondary | ICD-10-CM | POA: Diagnosis not present

## 2020-04-16 DIAGNOSIS — E039 Hypothyroidism, unspecified: Secondary | ICD-10-CM | POA: Diagnosis not present

## 2020-04-16 DIAGNOSIS — R809 Proteinuria, unspecified: Secondary | ICD-10-CM | POA: Diagnosis not present

## 2020-04-16 DIAGNOSIS — Z853 Personal history of malignant neoplasm of breast: Secondary | ICD-10-CM | POA: Diagnosis not present

## 2020-04-16 DIAGNOSIS — E1122 Type 2 diabetes mellitus with diabetic chronic kidney disease: Secondary | ICD-10-CM | POA: Diagnosis not present

## 2020-04-16 DIAGNOSIS — I10 Essential (primary) hypertension: Secondary | ICD-10-CM | POA: Diagnosis not present

## 2020-04-16 DIAGNOSIS — N183 Chronic kidney disease, stage 3 unspecified: Secondary | ICD-10-CM | POA: Diagnosis not present

## 2020-04-16 DIAGNOSIS — I6522 Occlusion and stenosis of left carotid artery: Secondary | ICD-10-CM | POA: Diagnosis not present

## 2020-04-17 ENCOUNTER — Other Ambulatory Visit (HOSPITAL_COMMUNITY): Payer: Self-pay | Admitting: Family Medicine

## 2020-04-17 DIAGNOSIS — I6522 Occlusion and stenosis of left carotid artery: Secondary | ICD-10-CM

## 2020-05-08 ENCOUNTER — Other Ambulatory Visit: Payer: Self-pay

## 2020-05-08 ENCOUNTER — Ambulatory Visit: Payer: PPO | Attending: Optometry | Admitting: Occupational Therapy

## 2020-05-08 DIAGNOSIS — H53413 Scotoma involving central area, bilateral: Secondary | ICD-10-CM

## 2020-05-09 ENCOUNTER — Encounter: Payer: Self-pay | Admitting: Occupational Therapy

## 2020-05-09 NOTE — Therapy (Signed)
[facility] [address] [place], [place], [address] Phone: [phone]   Fax:  [phone]  Occupational Therapy Evaluation  Patient Details  Name: Stacy Valentine MRN: [id number] Date of Birth: January 15, 1926 Referring Provider (OT): Dr. Delman Cheadle    Encounter Date: 05/08/2020   OT End of Session - 05/09/20 0833    Visit Number 1    Number of Visits 1    OT Start Time 8250    OT Stop Time 1320    OT Time Calculation (min) 45 min           History reviewed. No pertinent past medical history.  History reviewed. No pertinent surgical history.  There were no vitals filed for this visit.   Subjective Assessment - 05/08/20 1237    Subjective  Pt reports a decline in her vision    Patient Stated Goals to read or see better    Currently in Pain? No/denies             [facility] OT Assessment - 05/08/20 1239      Assessment   Medical Diagnosis macular degeneration    Referring Provider (OT) Dr. Delman Cheadle     Onset Date/Surgical Date 02/09/20      Precautions   Precautions Fall      Balance Screen   Has the patient fallen in the past 6 months No    Has the patient had a decrease in activity level because of a fear of falling?  No    Is the patient reluctant to leave their home because of a fear of falling?  No      Home  Environment   Family/patient expects to be discharged to: Private residence    Lives With Daughter      Prior Function   Level of Independence Independent with basic ADLs      IADL   Light Housekeeping Performs light daily tasks such as dishwashing, bed making    Meal Prep Able to complete simple warm meal prep      Vision Assessment   Vision Assessment Vision tested    Per MD/OD Report OD CF, OS 20/100    Reading Acuity (0.6)    Patient has diffculty with activities due to visual impairment Reading bills;Writing checks   reading fine print                   OT  Treatments/Exercises (OP) - 05/09/20 0842      ADLs   ADL Comments Pt/ dtr were instructed regarding eccentric viewing, use of 4x handheld magnifer, line guide, and binocular spectcles. Pt/ dtr verbalized understanding(Pt attempted use of 3x and 4x stand magnifier and electronic hand held magnifier, however pt found these difficult to use and preferred handheld magnifier.) Pt demonstrates ability to read continuous text with a 4x handheld magnifer, and she uses aNook at home with large print.                 OT Education - 05/09/20 0840    Education Details Pt/ dtr were educated regarding use of 4x handheld magnifier, line guide, binocular spctacles and eccentric viewing    Person(s) Educated Patient;Child(ren)    Methods Explanation;Demonstration;Verbal cues;Handout    Comprehension Verbalized understanding;Returned demonstration               OT Long Term Goals - 05/09/20 5397      OT LONG TERM GOAL #1   Title Pt/ caregiver will be  I with AE/ magnifier use.    Status Achieved   met on inital visit                Plan - 05/09/20 0834    Clinical Impression Statement Pt is a 86 y.o female with diagnosis of macular degeneration. Pt presents to occupational therapy with the visual deficits. Pt was seen on day of eval for education regarding: eccentric viewing, use of hand held, stand and electronic magnifiers and education regarding high marks, line guide. Pt/ dtr verbalized understanding of all education.    OT Occupational Profile and History Problem Focused Assessment - Including review of records relating to presenting problem    Occupational performance deficits (Please refer to evaluation for details): ADL's;IADL's;Leisure;Social Participation    Body Structure / Function / Physical Skills ADL;Vision;IADL    Rehab Potential Good    Clinical Decision Making Limited treatment options, no task modification necessary    Comorbidities Affecting Occupational  Performance: May have comorbidities impacting occupational performance    Modification or Assistance to Complete Evaluation  No modification of tasks or assist necessary to complete eval    OT Frequency One time visit    OT Duration 12 weeks    OT Treatment/Interventions Self-care/ADL training;DME and/or AE instruction;Patient/family education    Plan no further OT visists recommended, pt/ dtr demonstrate good understanding of all education.    Consulted and Agree with Plan of Care Patient;Family member/caregiver           Patient will benefit from skilled therapeutic intervention in order to improve the following deficits and impairments:   Body Structure / Function / Physical Skills: ADL, Vision, IADL       Visit Diagnosis: [place] involving central area, bilateral - Plan: Ot plan of care cert/re-cert    Problem List There are no problems to display for this patient.   Chasey Dull 05/09/2020, 8:56 AM Theone Murdoch, OTR/L [phone] Phone: [phone] 8:56 AM 05/09/20 [facility] [address] [place], [place], [address] Phone: [phone]   Fax:  [phone]  Name: Stacy Valentine MRN: [id number] Date of Birth: 1926-02-01

## 2020-06-04 DIAGNOSIS — M810 Age-related osteoporosis without current pathological fracture: Secondary | ICD-10-CM | POA: Diagnosis not present

## 2020-06-04 DIAGNOSIS — Z853 Personal history of malignant neoplasm of breast: Secondary | ICD-10-CM | POA: Diagnosis not present

## 2020-06-04 DIAGNOSIS — E11319 Type 2 diabetes mellitus with unspecified diabetic retinopathy without macular edema: Secondary | ICD-10-CM | POA: Diagnosis not present

## 2020-06-04 DIAGNOSIS — E782 Mixed hyperlipidemia: Secondary | ICD-10-CM | POA: Diagnosis not present

## 2020-06-04 DIAGNOSIS — E039 Hypothyroidism, unspecified: Secondary | ICD-10-CM | POA: Diagnosis not present

## 2020-06-04 DIAGNOSIS — N183 Chronic kidney disease, stage 3 unspecified: Secondary | ICD-10-CM | POA: Diagnosis not present

## 2020-06-04 DIAGNOSIS — I10 Essential (primary) hypertension: Secondary | ICD-10-CM | POA: Diagnosis not present

## 2020-06-04 DIAGNOSIS — E1122 Type 2 diabetes mellitus with diabetic chronic kidney disease: Secondary | ICD-10-CM | POA: Diagnosis not present

## 2020-06-06 DIAGNOSIS — Z1283 Encounter for screening for malignant neoplasm of skin: Secondary | ICD-10-CM | POA: Diagnosis not present

## 2020-06-06 DIAGNOSIS — D485 Neoplasm of uncertain behavior of skin: Secondary | ICD-10-CM | POA: Diagnosis not present

## 2020-06-06 DIAGNOSIS — D2261 Melanocytic nevi of right upper limb, including shoulder: Secondary | ICD-10-CM | POA: Diagnosis not present

## 2020-06-06 DIAGNOSIS — D225 Melanocytic nevi of trunk: Secondary | ICD-10-CM | POA: Diagnosis not present

## 2020-06-10 ENCOUNTER — Encounter (HOSPITAL_COMMUNITY): Payer: PPO

## 2020-06-14 DIAGNOSIS — Z780 Asymptomatic menopausal state: Secondary | ICD-10-CM | POA: Diagnosis not present

## 2020-06-14 DIAGNOSIS — Z1231 Encounter for screening mammogram for malignant neoplasm of breast: Secondary | ICD-10-CM | POA: Diagnosis not present

## 2020-06-14 DIAGNOSIS — M85851 Other specified disorders of bone density and structure, right thigh: Secondary | ICD-10-CM | POA: Diagnosis not present

## 2020-06-17 ENCOUNTER — Ambulatory Visit (HOSPITAL_COMMUNITY)
Admission: RE | Admit: 2020-06-17 | Discharge: 2020-06-17 | Disposition: A | Discharge status: Home / Self Care | Payer: PPO | Source: Ambulatory Visit | Attending: Family Medicine | Admitting: Family Medicine

## 2020-06-17 ENCOUNTER — Other Ambulatory Visit: Payer: Self-pay

## 2020-06-17 DIAGNOSIS — I6522 Occlusion and stenosis of left carotid artery: Secondary | ICD-10-CM | POA: Diagnosis not present

## 2020-07-17 DIAGNOSIS — N2581 Secondary hyperparathyroidism of renal origin: Secondary | ICD-10-CM | POA: Diagnosis not present

## 2020-07-17 DIAGNOSIS — R809 Proteinuria, unspecified: Secondary | ICD-10-CM | POA: Diagnosis not present

## 2020-07-17 DIAGNOSIS — N183 Chronic kidney disease, stage 3 unspecified: Secondary | ICD-10-CM | POA: Diagnosis not present

## 2020-07-17 DIAGNOSIS — I129 Hypertensive chronic kidney disease with stage 1 through stage 4 chronic kidney disease, or unspecified chronic kidney disease: Secondary | ICD-10-CM | POA: Diagnosis not present

## 2020-07-17 DIAGNOSIS — E1029 Type 1 diabetes mellitus with other diabetic kidney complication: Secondary | ICD-10-CM | POA: Diagnosis not present

## 2020-07-30 DIAGNOSIS — Z23 Encounter for immunization: Secondary | ICD-10-CM | POA: Diagnosis not present

## 2020-10-21 DIAGNOSIS — E782 Mixed hyperlipidemia: Secondary | ICD-10-CM | POA: Diagnosis not present

## 2020-10-21 DIAGNOSIS — R809 Proteinuria, unspecified: Secondary | ICD-10-CM | POA: Diagnosis not present

## 2020-10-21 DIAGNOSIS — E11319 Type 2 diabetes mellitus with unspecified diabetic retinopathy without macular edema: Secondary | ICD-10-CM | POA: Diagnosis not present

## 2020-10-21 DIAGNOSIS — N183 Chronic kidney disease, stage 3 unspecified: Secondary | ICD-10-CM | POA: Diagnosis not present

## 2020-10-21 DIAGNOSIS — I10 Essential (primary) hypertension: Secondary | ICD-10-CM | POA: Diagnosis not present

## 2020-10-21 DIAGNOSIS — E1122 Type 2 diabetes mellitus with diabetic chronic kidney disease: Secondary | ICD-10-CM | POA: Diagnosis not present

## 2020-10-21 DIAGNOSIS — E039 Hypothyroidism, unspecified: Secondary | ICD-10-CM | POA: Diagnosis not present

## 2020-10-21 DIAGNOSIS — M810 Age-related osteoporosis without current pathological fracture: Secondary | ICD-10-CM | POA: Diagnosis not present

## 2020-10-21 DIAGNOSIS — E559 Vitamin D deficiency, unspecified: Secondary | ICD-10-CM | POA: Diagnosis not present

## 2020-12-05 DIAGNOSIS — M810 Age-related osteoporosis without current pathological fracture: Secondary | ICD-10-CM | POA: Diagnosis not present

## 2020-12-05 DIAGNOSIS — Z853 Personal history of malignant neoplasm of breast: Secondary | ICD-10-CM | POA: Diagnosis not present

## 2020-12-05 DIAGNOSIS — N183 Chronic kidney disease, stage 3 unspecified: Secondary | ICD-10-CM | POA: Diagnosis not present

## 2020-12-05 DIAGNOSIS — E039 Hypothyroidism, unspecified: Secondary | ICD-10-CM | POA: Diagnosis not present

## 2020-12-05 DIAGNOSIS — E1122 Type 2 diabetes mellitus with diabetic chronic kidney disease: Secondary | ICD-10-CM | POA: Diagnosis not present

## 2020-12-05 DIAGNOSIS — E11319 Type 2 diabetes mellitus with unspecified diabetic retinopathy without macular edema: Secondary | ICD-10-CM | POA: Diagnosis not present

## 2020-12-05 DIAGNOSIS — I10 Essential (primary) hypertension: Secondary | ICD-10-CM | POA: Diagnosis not present

## 2020-12-05 DIAGNOSIS — E782 Mixed hyperlipidemia: Secondary | ICD-10-CM | POA: Diagnosis not present

## 2021-01-13 DIAGNOSIS — L900 Lichen sclerosus et atrophicus: Secondary | ICD-10-CM | POA: Diagnosis not present

## 2021-02-07 DIAGNOSIS — N183 Chronic kidney disease, stage 3 unspecified: Secondary | ICD-10-CM | POA: Diagnosis not present

## 2021-02-07 DIAGNOSIS — M810 Age-related osteoporosis without current pathological fracture: Secondary | ICD-10-CM | POA: Diagnosis not present

## 2021-02-07 DIAGNOSIS — E11319 Type 2 diabetes mellitus with unspecified diabetic retinopathy without macular edema: Secondary | ICD-10-CM | POA: Diagnosis not present

## 2021-02-07 DIAGNOSIS — Z853 Personal history of malignant neoplasm of breast: Secondary | ICD-10-CM | POA: Diagnosis not present

## 2021-02-07 DIAGNOSIS — E782 Mixed hyperlipidemia: Secondary | ICD-10-CM | POA: Diagnosis not present

## 2021-02-07 DIAGNOSIS — I10 Essential (primary) hypertension: Secondary | ICD-10-CM | POA: Diagnosis not present

## 2021-02-07 DIAGNOSIS — E1122 Type 2 diabetes mellitus with diabetic chronic kidney disease: Secondary | ICD-10-CM | POA: Diagnosis not present

## 2021-02-07 DIAGNOSIS — E039 Hypothyroidism, unspecified: Secondary | ICD-10-CM | POA: Diagnosis not present

## 2021-02-11 DIAGNOSIS — E119 Type 2 diabetes mellitus without complications: Secondary | ICD-10-CM | POA: Diagnosis not present

## 2021-02-11 DIAGNOSIS — H353133 Nonexudative age-related macular degeneration, bilateral, advanced atrophic without subfoveal involvement: Secondary | ICD-10-CM | POA: Diagnosis not present

## 2021-02-11 DIAGNOSIS — H5212 Myopia, left eye: Secondary | ICD-10-CM | POA: Diagnosis not present

## 2021-02-28 DIAGNOSIS — I129 Hypertensive chronic kidney disease with stage 1 through stage 4 chronic kidney disease, or unspecified chronic kidney disease: Secondary | ICD-10-CM | POA: Diagnosis not present

## 2021-02-28 DIAGNOSIS — E1029 Type 1 diabetes mellitus with other diabetic kidney complication: Secondary | ICD-10-CM | POA: Diagnosis not present

## 2021-02-28 DIAGNOSIS — N183 Chronic kidney disease, stage 3 unspecified: Secondary | ICD-10-CM | POA: Diagnosis not present

## 2021-02-28 DIAGNOSIS — R809 Proteinuria, unspecified: Secondary | ICD-10-CM | POA: Diagnosis not present

## 2021-02-28 DIAGNOSIS — N2581 Secondary hyperparathyroidism of renal origin: Secondary | ICD-10-CM | POA: Diagnosis not present

## 2021-03-17 DIAGNOSIS — L900 Lichen sclerosus et atrophicus: Secondary | ICD-10-CM | POA: Diagnosis not present

## 2021-03-24 DIAGNOSIS — R600 Localized edema: Secondary | ICD-10-CM | POA: Diagnosis not present

## 2021-03-25 DIAGNOSIS — I10 Essential (primary) hypertension: Secondary | ICD-10-CM | POA: Diagnosis not present

## 2021-03-25 DIAGNOSIS — E039 Hypothyroidism, unspecified: Secondary | ICD-10-CM | POA: Diagnosis not present

## 2021-03-25 DIAGNOSIS — E782 Mixed hyperlipidemia: Secondary | ICD-10-CM | POA: Diagnosis not present

## 2021-03-25 DIAGNOSIS — E1122 Type 2 diabetes mellitus with diabetic chronic kidney disease: Secondary | ICD-10-CM | POA: Diagnosis not present

## 2021-03-25 DIAGNOSIS — M810 Age-related osteoporosis without current pathological fracture: Secondary | ICD-10-CM | POA: Diagnosis not present

## 2021-03-25 DIAGNOSIS — N183 Chronic kidney disease, stage 3 unspecified: Secondary | ICD-10-CM | POA: Diagnosis not present

## 2021-03-25 DIAGNOSIS — E11319 Type 2 diabetes mellitus with unspecified diabetic retinopathy without macular edema: Secondary | ICD-10-CM | POA: Diagnosis not present

## 2021-03-26 DIAGNOSIS — I10 Essential (primary) hypertension: Secondary | ICD-10-CM | POA: Diagnosis not present

## 2021-03-26 DIAGNOSIS — Z6828 Body mass index (BMI) 28.0-28.9, adult: Secondary | ICD-10-CM | POA: Diagnosis not present

## 2021-03-26 DIAGNOSIS — M7989 Other specified soft tissue disorders: Secondary | ICD-10-CM | POA: Diagnosis not present

## 2021-04-21 DIAGNOSIS — Z0000 Encounter for general adult medical examination without abnormal findings: Secondary | ICD-10-CM | POA: Diagnosis not present

## 2021-04-21 DIAGNOSIS — E782 Mixed hyperlipidemia: Secondary | ICD-10-CM | POA: Diagnosis not present

## 2021-04-21 DIAGNOSIS — N183 Chronic kidney disease, stage 3 unspecified: Secondary | ICD-10-CM | POA: Diagnosis not present

## 2021-04-21 DIAGNOSIS — E559 Vitamin D deficiency, unspecified: Secondary | ICD-10-CM | POA: Diagnosis not present

## 2021-04-21 DIAGNOSIS — E039 Hypothyroidism, unspecified: Secondary | ICD-10-CM | POA: Diagnosis not present

## 2021-04-21 DIAGNOSIS — Z9010 Acquired absence of unspecified breast and nipple: Secondary | ICD-10-CM | POA: Diagnosis not present

## 2021-04-21 DIAGNOSIS — E1122 Type 2 diabetes mellitus with diabetic chronic kidney disease: Secondary | ICD-10-CM | POA: Diagnosis not present

## 2021-04-21 DIAGNOSIS — I10 Essential (primary) hypertension: Secondary | ICD-10-CM | POA: Diagnosis not present

## 2021-04-21 DIAGNOSIS — Z6828 Body mass index (BMI) 28.0-28.9, adult: Secondary | ICD-10-CM | POA: Diagnosis not present

## 2021-04-21 DIAGNOSIS — M810 Age-related osteoporosis without current pathological fracture: Secondary | ICD-10-CM | POA: Diagnosis not present

## 2021-04-22 DIAGNOSIS — N183 Chronic kidney disease, stage 3 unspecified: Secondary | ICD-10-CM | POA: Diagnosis not present

## 2021-04-22 DIAGNOSIS — E039 Hypothyroidism, unspecified: Secondary | ICD-10-CM | POA: Diagnosis not present

## 2021-04-22 DIAGNOSIS — M810 Age-related osteoporosis without current pathological fracture: Secondary | ICD-10-CM | POA: Diagnosis not present

## 2021-04-22 DIAGNOSIS — E1122 Type 2 diabetes mellitus with diabetic chronic kidney disease: Secondary | ICD-10-CM | POA: Diagnosis not present

## 2021-04-22 DIAGNOSIS — E11319 Type 2 diabetes mellitus with unspecified diabetic retinopathy without macular edema: Secondary | ICD-10-CM | POA: Diagnosis not present

## 2021-04-22 DIAGNOSIS — I10 Essential (primary) hypertension: Secondary | ICD-10-CM | POA: Diagnosis not present

## 2021-04-22 DIAGNOSIS — Z853 Personal history of malignant neoplasm of breast: Secondary | ICD-10-CM | POA: Diagnosis not present

## 2021-04-22 DIAGNOSIS — E782 Mixed hyperlipidemia: Secondary | ICD-10-CM | POA: Diagnosis not present

## 2021-05-09 DIAGNOSIS — Z1283 Encounter for screening for malignant neoplasm of skin: Secondary | ICD-10-CM | POA: Diagnosis not present

## 2021-05-09 DIAGNOSIS — D225 Melanocytic nevi of trunk: Secondary | ICD-10-CM | POA: Diagnosis not present

## 2021-05-09 DIAGNOSIS — L820 Inflamed seborrheic keratosis: Secondary | ICD-10-CM | POA: Diagnosis not present

## 2021-06-10 DIAGNOSIS — E039 Hypothyroidism, unspecified: Secondary | ICD-10-CM | POA: Diagnosis not present

## 2021-06-10 DIAGNOSIS — M810 Age-related osteoporosis without current pathological fracture: Secondary | ICD-10-CM | POA: Diagnosis not present

## 2021-06-10 DIAGNOSIS — E1122 Type 2 diabetes mellitus with diabetic chronic kidney disease: Secondary | ICD-10-CM | POA: Diagnosis not present

## 2021-06-10 DIAGNOSIS — E11319 Type 2 diabetes mellitus with unspecified diabetic retinopathy without macular edema: Secondary | ICD-10-CM | POA: Diagnosis not present

## 2021-06-10 DIAGNOSIS — I10 Essential (primary) hypertension: Secondary | ICD-10-CM | POA: Diagnosis not present

## 2021-06-10 DIAGNOSIS — Z853 Personal history of malignant neoplasm of breast: Secondary | ICD-10-CM | POA: Diagnosis not present

## 2021-06-10 DIAGNOSIS — E782 Mixed hyperlipidemia: Secondary | ICD-10-CM | POA: Diagnosis not present

## 2021-06-10 DIAGNOSIS — N183 Chronic kidney disease, stage 3 unspecified: Secondary | ICD-10-CM | POA: Diagnosis not present

## 2021-06-17 DIAGNOSIS — Z1231 Encounter for screening mammogram for malignant neoplasm of breast: Secondary | ICD-10-CM | POA: Diagnosis not present

## 2021-07-31 DIAGNOSIS — M170 Bilateral primary osteoarthritis of knee: Secondary | ICD-10-CM | POA: Diagnosis not present

## 2021-07-31 DIAGNOSIS — M1712 Unilateral primary osteoarthritis, left knee: Secondary | ICD-10-CM | POA: Diagnosis not present

## 2021-07-31 DIAGNOSIS — M1711 Unilateral primary osteoarthritis, right knee: Secondary | ICD-10-CM | POA: Diagnosis not present

## 2021-08-06 DIAGNOSIS — E039 Hypothyroidism, unspecified: Secondary | ICD-10-CM | POA: Diagnosis not present

## 2021-08-06 DIAGNOSIS — E782 Mixed hyperlipidemia: Secondary | ICD-10-CM | POA: Diagnosis not present

## 2021-08-06 DIAGNOSIS — N183 Chronic kidney disease, stage 3 unspecified: Secondary | ICD-10-CM | POA: Diagnosis not present

## 2021-08-06 DIAGNOSIS — M810 Age-related osteoporosis without current pathological fracture: Secondary | ICD-10-CM | POA: Diagnosis not present

## 2021-08-06 DIAGNOSIS — E11319 Type 2 diabetes mellitus with unspecified diabetic retinopathy without macular edema: Secondary | ICD-10-CM | POA: Diagnosis not present

## 2021-08-06 DIAGNOSIS — I10 Essential (primary) hypertension: Secondary | ICD-10-CM | POA: Diagnosis not present

## 2021-08-06 DIAGNOSIS — E1122 Type 2 diabetes mellitus with diabetic chronic kidney disease: Secondary | ICD-10-CM | POA: Diagnosis not present

## 2021-08-08 DIAGNOSIS — M170 Bilateral primary osteoarthritis of knee: Secondary | ICD-10-CM | POA: Diagnosis not present

## 2021-08-13 DIAGNOSIS — Z23 Encounter for immunization: Secondary | ICD-10-CM | POA: Diagnosis not present

## 2021-08-15 DIAGNOSIS — M170 Bilateral primary osteoarthritis of knee: Secondary | ICD-10-CM | POA: Diagnosis not present

## 2021-09-24 DIAGNOSIS — N183 Chronic kidney disease, stage 3 unspecified: Secondary | ICD-10-CM | POA: Diagnosis not present

## 2021-09-24 DIAGNOSIS — I129 Hypertensive chronic kidney disease with stage 1 through stage 4 chronic kidney disease, or unspecified chronic kidney disease: Secondary | ICD-10-CM | POA: Diagnosis not present

## 2021-09-24 DIAGNOSIS — E1029 Type 1 diabetes mellitus with other diabetic kidney complication: Secondary | ICD-10-CM | POA: Diagnosis not present

## 2021-09-24 DIAGNOSIS — N2581 Secondary hyperparathyroidism of renal origin: Secondary | ICD-10-CM | POA: Diagnosis not present

## 2021-09-24 DIAGNOSIS — R809 Proteinuria, unspecified: Secondary | ICD-10-CM | POA: Diagnosis not present

## 2021-10-22 DIAGNOSIS — E039 Hypothyroidism, unspecified: Secondary | ICD-10-CM | POA: Diagnosis not present

## 2021-10-22 DIAGNOSIS — E1122 Type 2 diabetes mellitus with diabetic chronic kidney disease: Secondary | ICD-10-CM | POA: Diagnosis not present

## 2021-10-22 DIAGNOSIS — Z6829 Body mass index (BMI) 29.0-29.9, adult: Secondary | ICD-10-CM | POA: Diagnosis not present

## 2021-10-22 DIAGNOSIS — L900 Lichen sclerosus et atrophicus: Secondary | ICD-10-CM | POA: Diagnosis not present

## 2021-10-22 DIAGNOSIS — E782 Mixed hyperlipidemia: Secondary | ICD-10-CM | POA: Diagnosis not present

## 2021-10-22 DIAGNOSIS — I10 Essential (primary) hypertension: Secondary | ICD-10-CM | POA: Diagnosis not present

## 2021-10-22 DIAGNOSIS — M810 Age-related osteoporosis without current pathological fracture: Secondary | ICD-10-CM | POA: Diagnosis not present

## 2021-12-26 DIAGNOSIS — E039 Hypothyroidism, unspecified: Secondary | ICD-10-CM | POA: Diagnosis not present

## 2022-02-10 DIAGNOSIS — E039 Hypothyroidism, unspecified: Secondary | ICD-10-CM | POA: Diagnosis not present

## 2022-02-12 DIAGNOSIS — H52202 Unspecified astigmatism, left eye: Secondary | ICD-10-CM | POA: Diagnosis not present

## 2022-02-12 DIAGNOSIS — H02403 Unspecified ptosis of bilateral eyelids: Secondary | ICD-10-CM | POA: Diagnosis not present

## 2022-02-12 DIAGNOSIS — E119 Type 2 diabetes mellitus without complications: Secondary | ICD-10-CM | POA: Diagnosis not present

## 2022-02-12 DIAGNOSIS — H353133 Nonexudative age-related macular degeneration, bilateral, advanced atrophic without subfoveal involvement: Secondary | ICD-10-CM | POA: Diagnosis not present

## 2022-02-24 DIAGNOSIS — I10 Essential (primary) hypertension: Secondary | ICD-10-CM | POA: Diagnosis not present

## 2022-02-24 DIAGNOSIS — E039 Hypothyroidism, unspecified: Secondary | ICD-10-CM | POA: Diagnosis not present

## 2022-02-24 DIAGNOSIS — M810 Age-related osteoporosis without current pathological fracture: Secondary | ICD-10-CM | POA: Diagnosis not present

## 2022-02-24 DIAGNOSIS — E1122 Type 2 diabetes mellitus with diabetic chronic kidney disease: Secondary | ICD-10-CM | POA: Diagnosis not present

## 2022-02-24 DIAGNOSIS — E782 Mixed hyperlipidemia: Secondary | ICD-10-CM | POA: Diagnosis not present

## 2022-03-17 DIAGNOSIS — N183 Chronic kidney disease, stage 3 unspecified: Secondary | ICD-10-CM | POA: Diagnosis not present

## 2022-03-17 DIAGNOSIS — E1029 Type 1 diabetes mellitus with other diabetic kidney complication: Secondary | ICD-10-CM | POA: Diagnosis not present

## 2022-03-17 DIAGNOSIS — I10 Essential (primary) hypertension: Secondary | ICD-10-CM | POA: Diagnosis not present

## 2022-03-17 DIAGNOSIS — R809 Proteinuria, unspecified: Secondary | ICD-10-CM | POA: Diagnosis not present

## 2022-03-17 DIAGNOSIS — E559 Vitamin D deficiency, unspecified: Secondary | ICD-10-CM | POA: Diagnosis not present

## 2022-03-17 DIAGNOSIS — N390 Urinary tract infection, site not specified: Secondary | ICD-10-CM | POA: Diagnosis not present

## 2022-03-17 DIAGNOSIS — N2581 Secondary hyperparathyroidism of renal origin: Secondary | ICD-10-CM | POA: Diagnosis not present

## 2022-03-18 DIAGNOSIS — I10 Essential (primary) hypertension: Secondary | ICD-10-CM | POA: Diagnosis not present

## 2022-03-18 DIAGNOSIS — L900 Lichen sclerosus et atrophicus: Secondary | ICD-10-CM | POA: Diagnosis not present

## 2022-04-01 DIAGNOSIS — M170 Bilateral primary osteoarthritis of knee: Secondary | ICD-10-CM | POA: Diagnosis not present

## 2022-04-08 DIAGNOSIS — M170 Bilateral primary osteoarthritis of knee: Secondary | ICD-10-CM | POA: Diagnosis not present

## 2022-04-09 DIAGNOSIS — I10 Essential (primary) hypertension: Secondary | ICD-10-CM | POA: Diagnosis not present

## 2022-04-09 DIAGNOSIS — Z79899 Other long term (current) drug therapy: Secondary | ICD-10-CM | POA: Diagnosis not present

## 2022-04-29 DIAGNOSIS — E559 Vitamin D deficiency, unspecified: Secondary | ICD-10-CM | POA: Diagnosis not present

## 2022-04-29 DIAGNOSIS — Z9010 Acquired absence of unspecified breast and nipple: Secondary | ICD-10-CM | POA: Diagnosis not present

## 2022-04-29 DIAGNOSIS — Z86010 Personal history of colonic polyps: Secondary | ICD-10-CM | POA: Diagnosis not present

## 2022-04-29 DIAGNOSIS — Z853 Personal history of malignant neoplasm of breast: Secondary | ICD-10-CM | POA: Diagnosis not present

## 2022-04-29 DIAGNOSIS — E1122 Type 2 diabetes mellitus with diabetic chronic kidney disease: Secondary | ICD-10-CM | POA: Diagnosis not present

## 2022-04-29 DIAGNOSIS — M810 Age-related osteoporosis without current pathological fracture: Secondary | ICD-10-CM | POA: Diagnosis not present

## 2022-04-29 DIAGNOSIS — E782 Mixed hyperlipidemia: Secondary | ICD-10-CM | POA: Diagnosis not present

## 2022-04-29 DIAGNOSIS — Z0000 Encounter for general adult medical examination without abnormal findings: Secondary | ICD-10-CM | POA: Diagnosis not present

## 2022-04-29 DIAGNOSIS — E039 Hypothyroidism, unspecified: Secondary | ICD-10-CM | POA: Diagnosis not present

## 2022-04-29 DIAGNOSIS — I10 Essential (primary) hypertension: Secondary | ICD-10-CM | POA: Diagnosis not present

## 2022-04-29 DIAGNOSIS — N183 Chronic kidney disease, stage 3 unspecified: Secondary | ICD-10-CM | POA: Diagnosis not present

## 2022-05-14 DIAGNOSIS — C44612 Basal cell carcinoma of skin of right upper limb, including shoulder: Secondary | ICD-10-CM | POA: Diagnosis not present

## 2022-05-14 DIAGNOSIS — X32XXXD Exposure to sunlight, subsequent encounter: Secondary | ICD-10-CM | POA: Diagnosis not present

## 2022-05-14 DIAGNOSIS — L570 Actinic keratosis: Secondary | ICD-10-CM | POA: Diagnosis not present

## 2022-05-14 DIAGNOSIS — L820 Inflamed seborrheic keratosis: Secondary | ICD-10-CM | POA: Diagnosis not present

## 2022-06-02 DIAGNOSIS — Z23 Encounter for immunization: Secondary | ICD-10-CM | POA: Diagnosis not present

## 2022-06-23 DIAGNOSIS — Z1231 Encounter for screening mammogram for malignant neoplasm of breast: Secondary | ICD-10-CM | POA: Diagnosis not present

## 2022-06-25 DIAGNOSIS — Z08 Encounter for follow-up examination after completed treatment for malignant neoplasm: Secondary | ICD-10-CM | POA: Diagnosis not present

## 2022-06-25 DIAGNOSIS — Z85828 Personal history of other malignant neoplasm of skin: Secondary | ICD-10-CM | POA: Diagnosis not present

## 2022-06-25 DIAGNOSIS — L570 Actinic keratosis: Secondary | ICD-10-CM | POA: Diagnosis not present

## 2022-06-25 DIAGNOSIS — X32XXXD Exposure to sunlight, subsequent encounter: Secondary | ICD-10-CM | POA: Diagnosis not present

## 2022-08-27 DIAGNOSIS — Z23 Encounter for immunization: Secondary | ICD-10-CM | POA: Diagnosis not present

## 2022-09-28 DIAGNOSIS — M170 Bilateral primary osteoarthritis of knee: Secondary | ICD-10-CM | POA: Diagnosis not present

## 2022-09-29 DIAGNOSIS — E559 Vitamin D deficiency, unspecified: Secondary | ICD-10-CM | POA: Diagnosis not present

## 2022-09-29 DIAGNOSIS — I129 Hypertensive chronic kidney disease with stage 1 through stage 4 chronic kidney disease, or unspecified chronic kidney disease: Secondary | ICD-10-CM | POA: Diagnosis not present

## 2022-09-29 DIAGNOSIS — R809 Proteinuria, unspecified: Secondary | ICD-10-CM | POA: Diagnosis not present

## 2022-09-29 DIAGNOSIS — N1831 Chronic kidney disease, stage 3a: Secondary | ICD-10-CM | POA: Diagnosis not present

## 2022-09-29 DIAGNOSIS — N2581 Secondary hyperparathyroidism of renal origin: Secondary | ICD-10-CM | POA: Diagnosis not present

## 2022-11-02 DIAGNOSIS — E782 Mixed hyperlipidemia: Secondary | ICD-10-CM | POA: Diagnosis not present

## 2022-11-02 DIAGNOSIS — E1122 Type 2 diabetes mellitus with diabetic chronic kidney disease: Secondary | ICD-10-CM | POA: Diagnosis not present

## 2022-11-02 DIAGNOSIS — I10 Essential (primary) hypertension: Secondary | ICD-10-CM | POA: Diagnosis not present

## 2022-11-02 DIAGNOSIS — E039 Hypothyroidism, unspecified: Secondary | ICD-10-CM | POA: Diagnosis not present

## 2022-11-02 DIAGNOSIS — N183 Chronic kidney disease, stage 3 unspecified: Secondary | ICD-10-CM | POA: Diagnosis not present

## 2022-11-02 DIAGNOSIS — M810 Age-related osteoporosis without current pathological fracture: Secondary | ICD-10-CM | POA: Diagnosis not present

## 2022-11-05 DIAGNOSIS — E782 Mixed hyperlipidemia: Secondary | ICD-10-CM | POA: Diagnosis not present

## 2022-11-05 DIAGNOSIS — I6522 Occlusion and stenosis of left carotid artery: Secondary | ICD-10-CM | POA: Diagnosis not present

## 2022-11-05 DIAGNOSIS — Z79899 Other long term (current) drug therapy: Secondary | ICD-10-CM | POA: Diagnosis not present

## 2022-11-05 DIAGNOSIS — E1122 Type 2 diabetes mellitus with diabetic chronic kidney disease: Secondary | ICD-10-CM | POA: Diagnosis not present

## 2022-11-05 DIAGNOSIS — E039 Hypothyroidism, unspecified: Secondary | ICD-10-CM | POA: Diagnosis not present

## 2022-11-05 DIAGNOSIS — Z6827 Body mass index (BMI) 27.0-27.9, adult: Secondary | ICD-10-CM | POA: Diagnosis not present

## 2022-11-05 DIAGNOSIS — I10 Essential (primary) hypertension: Secondary | ICD-10-CM | POA: Diagnosis not present

## 2022-11-11 DIAGNOSIS — M170 Bilateral primary osteoarthritis of knee: Secondary | ICD-10-CM | POA: Diagnosis not present

## 2022-11-18 DIAGNOSIS — M170 Bilateral primary osteoarthritis of knee: Secondary | ICD-10-CM | POA: Diagnosis not present

## 2022-11-25 DIAGNOSIS — M170 Bilateral primary osteoarthritis of knee: Secondary | ICD-10-CM | POA: Diagnosis not present

## 2022-12-24 DIAGNOSIS — L821 Other seborrheic keratosis: Secondary | ICD-10-CM | POA: Diagnosis not present

## 2022-12-24 DIAGNOSIS — X32XXXD Exposure to sunlight, subsequent encounter: Secondary | ICD-10-CM | POA: Diagnosis not present

## 2022-12-24 DIAGNOSIS — L570 Actinic keratosis: Secondary | ICD-10-CM | POA: Diagnosis not present

## 2023-02-23 DIAGNOSIS — H353133 Nonexudative age-related macular degeneration, bilateral, advanced atrophic without subfoveal involvement: Secondary | ICD-10-CM | POA: Diagnosis not present

## 2023-03-24 DIAGNOSIS — L900 Lichen sclerosus et atrophicus: Secondary | ICD-10-CM | POA: Diagnosis not present

## 2023-03-29 ENCOUNTER — Ambulatory Visit: Payer: PPO | Admitting: Podiatry

## 2023-03-29 ENCOUNTER — Other Ambulatory Visit: Payer: Self-pay | Admitting: *Deleted

## 2023-03-29 ENCOUNTER — Encounter: Payer: Self-pay | Admitting: Podiatry

## 2023-03-29 DIAGNOSIS — B351 Tinea unguium: Secondary | ICD-10-CM | POA: Diagnosis not present

## 2023-03-29 DIAGNOSIS — E119 Type 2 diabetes mellitus without complications: Secondary | ICD-10-CM | POA: Insufficient documentation

## 2023-03-29 DIAGNOSIS — M79675 Pain in left toe(s): Secondary | ICD-10-CM

## 2023-03-29 DIAGNOSIS — M79674 Pain in right toe(s): Secondary | ICD-10-CM

## 2023-03-29 NOTE — Progress Notes (Addendum)
This patient presents to the office with chief complaint of long thick nails and diabetic feet.  This patient  says there  is  no pain and discomfort in her feet.  This patient says there are long thick painful  big nails.  These nails are painful walking and wearing shoes.  Patient has no history of infection or drainage from both feet.  Patient is unable to  self treat her own nails . This patient presents  to the office today for treatment of the  long nails and a foot evaluation due to history of  diabetes.  General Appearance  Alert, conversant and in no acute stress.  Vascular  Dorsalis pedis  pulses are palpable  bilaterally.  Posterior tiibal pulses are absent.  Capillary return is within normal limits  bilaterally. Temperature is within normal limits  bilaterally.Swelling feet  B/l.  Absent digital hair.  Neurologic  Senn-Weinstein monofilament wire test within normal limits  bilaterally. Muscle power within normal limits bilaterally.  Nails Thick disfigured discolored nails with subungual debris  from hallux to fifth toes bilaterally. No evidence of bacterial infection or drainage bilaterally.  Orthopedic  No limitations of motion of motion feet .  No crepitus or effusions noted.  No bony pathology or digital deformities noted.  Skin  normotropic skin with no porokeratosis noted bilaterally.  No signs of infections or ulcers noted.     Onychomycosis  Diabetes with no foot complications  IE  Debride nails x 10.  A diabetic foot exam was performed and there is no evidence of any vascular or neurologic pathology.   RTC 3 months.   Helane Gunther DPM

## 2023-03-30 DIAGNOSIS — N2581 Secondary hyperparathyroidism of renal origin: Secondary | ICD-10-CM | POA: Diagnosis not present

## 2023-03-30 DIAGNOSIS — E1022 Type 1 diabetes mellitus with diabetic chronic kidney disease: Secondary | ICD-10-CM | POA: Diagnosis not present

## 2023-03-30 DIAGNOSIS — E559 Vitamin D deficiency, unspecified: Secondary | ICD-10-CM | POA: Diagnosis not present

## 2023-03-30 DIAGNOSIS — R809 Proteinuria, unspecified: Secondary | ICD-10-CM | POA: Diagnosis not present

## 2023-03-30 DIAGNOSIS — I129 Hypertensive chronic kidney disease with stage 1 through stage 4 chronic kidney disease, or unspecified chronic kidney disease: Secondary | ICD-10-CM | POA: Diagnosis not present

## 2023-03-30 DIAGNOSIS — N1831 Chronic kidney disease, stage 3a: Secondary | ICD-10-CM | POA: Diagnosis not present

## 2023-05-26 DIAGNOSIS — N2581 Secondary hyperparathyroidism of renal origin: Secondary | ICD-10-CM | POA: Diagnosis not present

## 2023-05-26 DIAGNOSIS — Z0000 Encounter for general adult medical examination without abnormal findings: Secondary | ICD-10-CM | POA: Diagnosis not present

## 2023-05-26 DIAGNOSIS — H3530 Unspecified macular degeneration: Secondary | ICD-10-CM | POA: Diagnosis not present

## 2023-05-26 DIAGNOSIS — Z9989 Dependence on other enabling machines and devices: Secondary | ICD-10-CM | POA: Diagnosis not present

## 2023-06-15 DIAGNOSIS — E11319 Type 2 diabetes mellitus with unspecified diabetic retinopathy without macular edema: Secondary | ICD-10-CM | POA: Diagnosis not present

## 2023-06-15 DIAGNOSIS — M8580 Other specified disorders of bone density and structure, unspecified site: Secondary | ICD-10-CM | POA: Diagnosis not present

## 2023-06-15 DIAGNOSIS — D692 Other nonthrombocytopenic purpura: Secondary | ICD-10-CM | POA: Diagnosis not present

## 2023-06-15 DIAGNOSIS — Z6826 Body mass index (BMI) 26.0-26.9, adult: Secondary | ICD-10-CM | POA: Diagnosis not present

## 2023-06-15 DIAGNOSIS — Z9989 Dependence on other enabling machines and devices: Secondary | ICD-10-CM | POA: Diagnosis not present

## 2023-06-15 DIAGNOSIS — E782 Mixed hyperlipidemia: Secondary | ICD-10-CM | POA: Diagnosis not present

## 2023-06-15 DIAGNOSIS — E039 Hypothyroidism, unspecified: Secondary | ICD-10-CM | POA: Diagnosis not present

## 2023-06-15 DIAGNOSIS — N1831 Chronic kidney disease, stage 3a: Secondary | ICD-10-CM | POA: Diagnosis not present

## 2023-06-15 DIAGNOSIS — E1122 Type 2 diabetes mellitus with diabetic chronic kidney disease: Secondary | ICD-10-CM | POA: Diagnosis not present

## 2023-06-24 DIAGNOSIS — L570 Actinic keratosis: Secondary | ICD-10-CM | POA: Diagnosis not present

## 2023-06-24 DIAGNOSIS — B078 Other viral warts: Secondary | ICD-10-CM | POA: Diagnosis not present

## 2023-06-24 DIAGNOSIS — L820 Inflamed seborrheic keratosis: Secondary | ICD-10-CM | POA: Diagnosis not present

## 2023-06-24 DIAGNOSIS — X32XXXD Exposure to sunlight, subsequent encounter: Secondary | ICD-10-CM | POA: Diagnosis not present

## 2023-06-30 ENCOUNTER — Encounter: Payer: Self-pay | Admitting: Podiatry

## 2023-06-30 ENCOUNTER — Ambulatory Visit (INDEPENDENT_AMBULATORY_CARE_PROVIDER_SITE_OTHER): Payer: PPO | Admitting: Podiatry

## 2023-06-30 DIAGNOSIS — B351 Tinea unguium: Secondary | ICD-10-CM

## 2023-06-30 DIAGNOSIS — E119 Type 2 diabetes mellitus without complications: Secondary | ICD-10-CM | POA: Diagnosis not present

## 2023-06-30 DIAGNOSIS — M79674 Pain in right toe(s): Secondary | ICD-10-CM

## 2023-06-30 DIAGNOSIS — M79675 Pain in left toe(s): Secondary | ICD-10-CM

## 2023-06-30 NOTE — Progress Notes (Addendum)
This patient returns to my office for at risk foot care.  This patient requires this care by a professional since this patient will be at risk due to having diabetes.   This patient is unable to cut nails herself since the patient cannot reach her nails.These nails are painful walking and wearing shoes.  This patient presents for at risk foot care today.  General Appearance  Alert, conversant and in no acute stress.  Vascular  Dorsalis pedis  pulses are palpable  bilaterally. Posterior tibial pulses are not palpable. Capillary return is within normal limits  bilaterally. Temperature is within normal limits  bilaterally.  Swelling feet  B/L.  Absent digital hair.  Neurologic  Senn-Weinstein monofilament wire test within normal limits  bilaterally. Muscle power within normal limits bilaterally.  Nails Thick disfigured discolored nails with subungual debris  from hallux to fifth toes bilaterally. No evidence of bacterial infection or drainage bilaterally.  Orthopedic  No limitations of motion  feet .  No crepitus or effusions noted.  No bony pathology or digital deformities noted.  Skin  normotropic skin with no porokeratosis noted bilaterally.  No signs of infections or ulcers noted.     Onychomycosis  Pain in right toes  Pain in left toes  Consent was obtained for treatment procedures.   Mechanical debridement of nails 1-5  bilaterally performed with a nail nipper.  Filed with dremel without incident.    Return office visit  3 months                    Told patient to return for periodic foot care and evaluation due to potential at risk complications.   Helane Gunther DPM

## 2023-07-06 DIAGNOSIS — Z1231 Encounter for screening mammogram for malignant neoplasm of breast: Secondary | ICD-10-CM | POA: Diagnosis not present

## 2023-08-06 DIAGNOSIS — Z23 Encounter for immunization: Secondary | ICD-10-CM | POA: Diagnosis not present

## 2023-09-09 DIAGNOSIS — X32XXXD Exposure to sunlight, subsequent encounter: Secondary | ICD-10-CM | POA: Diagnosis not present

## 2023-09-09 DIAGNOSIS — D225 Melanocytic nevi of trunk: Secondary | ICD-10-CM | POA: Diagnosis not present

## 2023-09-09 DIAGNOSIS — L570 Actinic keratosis: Secondary | ICD-10-CM | POA: Diagnosis not present

## 2023-11-01 ENCOUNTER — Ambulatory Visit: Payer: PPO | Admitting: Podiatry

## 2023-11-03 DIAGNOSIS — E559 Vitamin D deficiency, unspecified: Secondary | ICD-10-CM | POA: Diagnosis not present

## 2023-11-03 DIAGNOSIS — R809 Proteinuria, unspecified: Secondary | ICD-10-CM | POA: Diagnosis not present

## 2023-11-03 DIAGNOSIS — N2581 Secondary hyperparathyroidism of renal origin: Secondary | ICD-10-CM | POA: Diagnosis not present

## 2023-11-03 DIAGNOSIS — N1831 Chronic kidney disease, stage 3a: Secondary | ICD-10-CM | POA: Diagnosis not present

## 2023-11-08 ENCOUNTER — Ambulatory Visit: Payer: PPO | Admitting: Podiatry

## 2023-11-08 ENCOUNTER — Encounter: Payer: Self-pay | Admitting: Podiatry

## 2023-11-08 DIAGNOSIS — M79675 Pain in left toe(s): Secondary | ICD-10-CM | POA: Diagnosis not present

## 2023-11-08 DIAGNOSIS — E119 Type 2 diabetes mellitus without complications: Secondary | ICD-10-CM

## 2023-11-08 DIAGNOSIS — M79674 Pain in right toe(s): Secondary | ICD-10-CM | POA: Diagnosis not present

## 2023-11-08 DIAGNOSIS — B351 Tinea unguium: Secondary | ICD-10-CM | POA: Diagnosis not present

## 2023-11-08 NOTE — Progress Notes (Signed)
This patient returns to my office for at risk foot care.  This patient requires this care by a professional since this patient will be at risk due to having diabetes.   This patient is unable to cut nails herself since the patient cannot reach her nails.These nails are painful walking and wearing shoes.  This patient presents for at risk foot care today.  General Appearance  Alert, conversant and in no acute stress.  Vascular  Dorsalis pedis  pulses are palpable  bilaterally. Posterior tibial pulses are not palpable. Capillary return is within normal limits  bilaterally. Temperature is within normal limits  bilaterally.  Swelling feet  B/L.  Absent digital hair.  Neurologic  Senn-Weinstein monofilament wire test within normal limits  bilaterally. Muscle power within normal limits bilaterally.  Nails Thick disfigured discolored nails with subungual debris  from hallux to fifth toes bilaterally. No evidence of bacterial infection or drainage bilaterally.  Orthopedic  No limitations of motion  feet .  No crepitus or effusions noted.  No bony pathology or digital deformities noted.  Skin  normotropic skin with no porokeratosis noted bilaterally.  No signs of infections or ulcers noted.     Onychomycosis  Pain in right toes  Pain in left toes  Consent was obtained for treatment procedures.   Mechanical debridement of nails 1-5  bilaterally performed with a nail nipper.  Filed with dremel without incident.    Return office visit  3 months                    Told patient to return for periodic foot care and evaluation due to potential at risk complications.   Helane Gunther DPM

## 2024-02-09 ENCOUNTER — Encounter: Payer: Self-pay | Admitting: Podiatry

## 2024-02-09 ENCOUNTER — Ambulatory Visit: Payer: PPO | Admitting: Podiatry

## 2024-02-09 DIAGNOSIS — M79675 Pain in left toe(s): Secondary | ICD-10-CM

## 2024-02-09 DIAGNOSIS — E119 Type 2 diabetes mellitus without complications: Secondary | ICD-10-CM

## 2024-02-09 DIAGNOSIS — B351 Tinea unguium: Secondary | ICD-10-CM

## 2024-02-09 DIAGNOSIS — M79674 Pain in right toe(s): Secondary | ICD-10-CM | POA: Diagnosis not present

## 2024-02-09 NOTE — Patient Instructions (Signed)
This patient returns to my office for at risk foot care.  This patient requires this care by a professional since this patient will be at risk due to having diabetes.   This patient is unable to cut nails herself since the patient cannot reach her nails.These nails are painful walking and wearing shoes.  This patient presents for at risk foot care today.  General Appearance  Alert, conversant and in no acute stress.  Vascular  Dorsalis pedis  pulses are palpable  bilaterally. Posterior tibial pulses are not palpable. Capillary return is within normal limits  bilaterally. Temperature is within normal limits  bilaterally.  Swelling feet  B/L.  Absent digital hair.  Neurologic  Senn-Weinstein monofilament wire test within normal limits  bilaterally. Muscle power within normal limits bilaterally.  Nails Thick disfigured discolored nails with subungual debris  from hallux to fifth toes bilaterally. No evidence of bacterial infection or drainage bilaterally.  Orthopedic  No limitations of motion  feet .  No crepitus or effusions noted.  No bony pathology or digital deformities noted.  Skin  normotropic skin with no porokeratosis noted bilaterally.  No signs of infections or ulcers noted.     Onychomycosis  Pain in right toes  Pain in left toes  Consent was obtained for treatment procedures.   Mechanical debridement of nails 1-5  bilaterally performed with a nail nipper.  Filed with dremel without incident.    Return office visit  3 months                    Told patient to return for periodic foot care and evaluation due to potential at risk complications.   Helane Gunther DPM

## 2024-02-09 NOTE — Progress Notes (Signed)
This patient returns to my office for at risk foot care.  This patient requires this care by a professional since this patient will be at risk due to having diabetes.   This patient is unable to cut nails herself since the patient cannot reach her nails.These nails are painful walking and wearing shoes.  This patient presents for at risk foot care today.  General Appearance  Alert, conversant and in no acute stress.  Vascular  Dorsalis pedis  pulses are palpable  bilaterally. Posterior tibial pulses are not palpable. Capillary return is within normal limits  bilaterally. Temperature is within normal limits  bilaterally.  Swelling feet  B/L.  Absent digital hair.  Neurologic  Senn-Weinstein monofilament wire test within normal limits  bilaterally. Muscle power within normal limits bilaterally.  Nails Thick disfigured discolored nails with subungual debris  from hallux to fifth toes bilaterally. No evidence of bacterial infection or drainage bilaterally.  Orthopedic  No limitations of motion  feet .  No crepitus or effusions noted.  No bony pathology or digital deformities noted.  Skin  normotropic skin with no porokeratosis noted bilaterally.  No signs of infections or ulcers noted.     Onychomycosis  Pain in right toes  Pain in left toes  Consent was obtained for treatment procedures.   Mechanical debridement of nails 1-5  bilaterally performed with a nail nipper.  Filed with dremel without incident.    Return office visit  3 months                    Told patient to return for periodic foot care and evaluation due to potential at risk complications.   Helane Gunther DPM

## 2024-03-09 DIAGNOSIS — X32XXXD Exposure to sunlight, subsequent encounter: Secondary | ICD-10-CM | POA: Diagnosis not present

## 2024-03-09 DIAGNOSIS — L570 Actinic keratosis: Secondary | ICD-10-CM | POA: Diagnosis not present

## 2024-03-23 DIAGNOSIS — L900 Lichen sclerosus et atrophicus: Secondary | ICD-10-CM | POA: Diagnosis not present

## 2024-03-30 DIAGNOSIS — M170 Bilateral primary osteoarthritis of knee: Secondary | ICD-10-CM | POA: Diagnosis not present

## 2024-03-31 DIAGNOSIS — H353133 Nonexudative age-related macular degeneration, bilateral, advanced atrophic without subfoveal involvement: Secondary | ICD-10-CM | POA: Diagnosis not present

## 2024-03-31 DIAGNOSIS — E113293 Type 2 diabetes mellitus with mild nonproliferative diabetic retinopathy without macular edema, bilateral: Secondary | ICD-10-CM | POA: Diagnosis not present

## 2024-04-06 DIAGNOSIS — M170 Bilateral primary osteoarthritis of knee: Secondary | ICD-10-CM | POA: Diagnosis not present

## 2024-04-13 DIAGNOSIS — M170 Bilateral primary osteoarthritis of knee: Secondary | ICD-10-CM | POA: Diagnosis not present

## 2024-05-10 ENCOUNTER — Encounter: Payer: Self-pay | Admitting: Podiatry

## 2024-05-10 ENCOUNTER — Ambulatory Visit: Admitting: Podiatry

## 2024-05-10 DIAGNOSIS — N2581 Secondary hyperparathyroidism of renal origin: Secondary | ICD-10-CM | POA: Diagnosis not present

## 2024-05-10 DIAGNOSIS — M79674 Pain in right toe(s): Secondary | ICD-10-CM

## 2024-05-10 DIAGNOSIS — B351 Tinea unguium: Secondary | ICD-10-CM | POA: Diagnosis not present

## 2024-05-10 DIAGNOSIS — N1831 Chronic kidney disease, stage 3a: Secondary | ICD-10-CM | POA: Diagnosis not present

## 2024-05-10 DIAGNOSIS — I129 Hypertensive chronic kidney disease with stage 1 through stage 4 chronic kidney disease, or unspecified chronic kidney disease: Secondary | ICD-10-CM | POA: Diagnosis not present

## 2024-05-10 DIAGNOSIS — E119 Type 2 diabetes mellitus without complications: Secondary | ICD-10-CM

## 2024-05-10 DIAGNOSIS — E1022 Type 1 diabetes mellitus with diabetic chronic kidney disease: Secondary | ICD-10-CM | POA: Diagnosis not present

## 2024-05-10 DIAGNOSIS — M79675 Pain in left toe(s): Secondary | ICD-10-CM

## 2024-05-10 DIAGNOSIS — R809 Proteinuria, unspecified: Secondary | ICD-10-CM | POA: Diagnosis not present

## 2024-05-10 DIAGNOSIS — E559 Vitamin D deficiency, unspecified: Secondary | ICD-10-CM | POA: Diagnosis not present

## 2024-05-10 NOTE — Progress Notes (Signed)
This patient returns to my office for at risk foot care.  This patient requires this care by a professional since this patient will be at risk due to having diabetes.   This patient is unable to cut nails herself since the patient cannot reach her nails.These nails are painful walking and wearing shoes.  This patient presents for at risk foot care today.  General Appearance  Alert, conversant and in no acute stress.  Vascular  Dorsalis pedis  pulses are palpable  bilaterally. Posterior tibial pulses are not palpable. Capillary return is within normal limits  bilaterally. Temperature is within normal limits  bilaterally.  Swelling feet  B/L.  Absent digital hair.  Neurologic  Senn-Weinstein monofilament wire test within normal limits  bilaterally. Muscle power within normal limits bilaterally.  Nails Thick disfigured discolored nails with subungual debris  from hallux to fifth toes bilaterally. No evidence of bacterial infection or drainage bilaterally.  Orthopedic  No limitations of motion  feet .  No crepitus or effusions noted.  No bony pathology or digital deformities noted.  Skin  normotropic skin with no porokeratosis noted bilaterally.  No signs of infections or ulcers noted.     Onychomycosis  Pain in right toes  Pain in left toes  Consent was obtained for treatment procedures.   Mechanical debridement of nails 1-5  bilaterally performed with a nail nipper.  Filed with dremel without incident.    Return office visit  3 months                    Told patient to return for periodic foot care and evaluation due to potential at risk complications.   Helane Gunther DPM

## 2024-06-01 DIAGNOSIS — E1122 Type 2 diabetes mellitus with diabetic chronic kidney disease: Secondary | ICD-10-CM | POA: Diagnosis not present

## 2024-06-01 DIAGNOSIS — Z1159 Encounter for screening for other viral diseases: Secondary | ICD-10-CM | POA: Diagnosis not present

## 2024-06-01 DIAGNOSIS — Z1331 Encounter for screening for depression: Secondary | ICD-10-CM | POA: Diagnosis not present

## 2024-06-01 DIAGNOSIS — I129 Hypertensive chronic kidney disease with stage 1 through stage 4 chronic kidney disease, or unspecified chronic kidney disease: Secondary | ICD-10-CM | POA: Diagnosis not present

## 2024-06-01 DIAGNOSIS — G2581 Restless legs syndrome: Secondary | ICD-10-CM | POA: Diagnosis not present

## 2024-06-01 DIAGNOSIS — N1832 Chronic kidney disease, stage 3b: Secondary | ICD-10-CM | POA: Diagnosis not present

## 2024-06-01 DIAGNOSIS — Z0000 Encounter for general adult medical examination without abnormal findings: Secondary | ICD-10-CM | POA: Diagnosis not present

## 2024-06-01 DIAGNOSIS — M8580 Other specified disorders of bone density and structure, unspecified site: Secondary | ICD-10-CM | POA: Diagnosis not present

## 2024-06-15 DIAGNOSIS — R509 Fever, unspecified: Secondary | ICD-10-CM | POA: Diagnosis not present

## 2024-06-15 DIAGNOSIS — Z6826 Body mass index (BMI) 26.0-26.9, adult: Secondary | ICD-10-CM | POA: Diagnosis not present

## 2024-07-06 DIAGNOSIS — Z6825 Body mass index (BMI) 25.0-25.9, adult: Secondary | ICD-10-CM | POA: Diagnosis not present

## 2024-07-06 DIAGNOSIS — H3530 Unspecified macular degeneration: Secondary | ICD-10-CM | POA: Diagnosis not present

## 2024-07-06 DIAGNOSIS — R441 Visual hallucinations: Secondary | ICD-10-CM | POA: Diagnosis not present

## 2024-07-06 DIAGNOSIS — G2581 Restless legs syndrome: Secondary | ICD-10-CM | POA: Diagnosis not present

## 2024-07-11 DIAGNOSIS — Z1231 Encounter for screening mammogram for malignant neoplasm of breast: Secondary | ICD-10-CM | POA: Diagnosis not present

## 2024-08-02 DIAGNOSIS — H903 Sensorineural hearing loss, bilateral: Secondary | ICD-10-CM | POA: Diagnosis not present

## 2024-08-03 DIAGNOSIS — Z23 Encounter for immunization: Secondary | ICD-10-CM | POA: Diagnosis not present

## 2024-08-03 DIAGNOSIS — H6121 Impacted cerumen, right ear: Secondary | ICD-10-CM | POA: Diagnosis not present

## 2024-08-03 DIAGNOSIS — Z6826 Body mass index (BMI) 26.0-26.9, adult: Secondary | ICD-10-CM | POA: Diagnosis not present

## 2024-08-03 DIAGNOSIS — H9193 Unspecified hearing loss, bilateral: Secondary | ICD-10-CM | POA: Diagnosis not present

## 2024-08-07 ENCOUNTER — Encounter: Payer: Self-pay | Admitting: Podiatry

## 2024-08-07 ENCOUNTER — Ambulatory Visit: Admitting: Podiatry

## 2024-08-07 DIAGNOSIS — M79675 Pain in left toe(s): Secondary | ICD-10-CM

## 2024-08-07 DIAGNOSIS — M79674 Pain in right toe(s): Secondary | ICD-10-CM

## 2024-08-07 DIAGNOSIS — E119 Type 2 diabetes mellitus without complications: Secondary | ICD-10-CM | POA: Diagnosis not present

## 2024-08-07 DIAGNOSIS — B351 Tinea unguium: Secondary | ICD-10-CM | POA: Diagnosis not present

## 2024-08-07 NOTE — Progress Notes (Signed)
 This patient returns to my office for at risk foot care.  This patient requires this care by a professional since this patient will be at risk due to having diabetes.   This patient is unable to cut nails herself since the patient cannot reach her nails.These nails are painful walking and wearing shoes. She presents to the office with a female caregiver. This patient presents for at risk foot care today.  General Appearance  Alert, conversant and in no acute stress.  Vascular  Dorsalis pedis  pulses are  weakly palpable  bilaterally. Posterior tibial pulses are not palpable. Capillary return is within normal limits  bilaterally. Temperature is within normal limits  bilaterally.  Swelling feet  B/L.  Absent digital hair.  Neurologic  Senn-Weinstein monofilament wire test within normal limits  bilaterally. Muscle power within normal limits bilaterally.  Nails Thick disfigured discolored nails with subungual debris  from hallux to fifth toes bilaterally. No evidence of bacterial infection or drainage bilaterally.  Orthopedic  No limitations of motion  feet .  No crepitus or effusions noted.  No bony pathology or digital deformities noted.  Skin  normotropic skin with no porokeratosis noted bilaterally.  No signs of infections or ulcers noted.     Onychomycosis  Pain in right toes  Pain in left toes  Consent was obtained for treatment procedures.   Mechanical debridement of nails 1-5  bilaterally performed with a nail nipper.  Filed with dremel without incident.    Return office visit  3 months                    Told patient to return for periodic foot care and evaluation due to potential at risk complications.   Cordella Bold DPM

## 2024-08-22 DIAGNOSIS — I129 Hypertensive chronic kidney disease with stage 1 through stage 4 chronic kidney disease, or unspecified chronic kidney disease: Secondary | ICD-10-CM | POA: Diagnosis not present

## 2024-08-22 DIAGNOSIS — N1832 Chronic kidney disease, stage 3b: Secondary | ICD-10-CM | POA: Diagnosis not present

## 2024-09-13 ENCOUNTER — Emergency Department (HOSPITAL_BASED_OUTPATIENT_CLINIC_OR_DEPARTMENT_OTHER)

## 2024-09-13 ENCOUNTER — Other Ambulatory Visit: Payer: Self-pay

## 2024-09-13 ENCOUNTER — Emergency Department (HOSPITAL_BASED_OUTPATIENT_CLINIC_OR_DEPARTMENT_OTHER): Admission: EM | Admit: 2024-09-13 | Discharge: 2024-09-13 | Disposition: A | Discharge status: Home / Self Care

## 2024-09-13 ENCOUNTER — Emergency Department (HOSPITAL_BASED_OUTPATIENT_CLINIC_OR_DEPARTMENT_OTHER): Admitting: Radiology

## 2024-09-13 ENCOUNTER — Encounter (HOSPITAL_BASED_OUTPATIENT_CLINIC_OR_DEPARTMENT_OTHER): Payer: Self-pay | Admitting: Emergency Medicine

## 2024-09-13 DIAGNOSIS — Z7984 Long term (current) use of oral hypoglycemic drugs: Secondary | ICD-10-CM | POA: Diagnosis not present

## 2024-09-13 DIAGNOSIS — M5136 Other intervertebral disc degeneration, lumbar region with discogenic back pain only: Secondary | ICD-10-CM | POA: Diagnosis not present

## 2024-09-13 DIAGNOSIS — M160 Bilateral primary osteoarthritis of hip: Secondary | ICD-10-CM | POA: Diagnosis not present

## 2024-09-13 DIAGNOSIS — M1712 Unilateral primary osteoarthritis, left knee: Secondary | ICD-10-CM | POA: Diagnosis not present

## 2024-09-13 DIAGNOSIS — K4090 Unilateral inguinal hernia, without obstruction or gangrene, not specified as recurrent: Secondary | ICD-10-CM | POA: Diagnosis not present

## 2024-09-13 DIAGNOSIS — R54 Age-related physical debility: Secondary | ICD-10-CM | POA: Insufficient documentation

## 2024-09-13 DIAGNOSIS — M1711 Unilateral primary osteoarthritis, right knee: Secondary | ICD-10-CM | POA: Diagnosis not present

## 2024-09-13 DIAGNOSIS — M25569 Pain in unspecified knee: Secondary | ICD-10-CM | POA: Diagnosis not present

## 2024-09-13 DIAGNOSIS — I700 Atherosclerosis of aorta: Secondary | ICD-10-CM | POA: Diagnosis not present

## 2024-09-13 DIAGNOSIS — Z79899 Other long term (current) drug therapy: Secondary | ICD-10-CM | POA: Insufficient documentation

## 2024-09-13 DIAGNOSIS — K5730 Diverticulosis of large intestine without perforation or abscess without bleeding: Secondary | ICD-10-CM | POA: Diagnosis not present

## 2024-09-13 DIAGNOSIS — M25559 Pain in unspecified hip: Secondary | ICD-10-CM | POA: Diagnosis not present

## 2024-09-13 DIAGNOSIS — M79606 Pain in leg, unspecified: Secondary | ICD-10-CM | POA: Diagnosis not present

## 2024-09-13 DIAGNOSIS — M1710 Unilateral primary osteoarthritis, unspecified knee: Secondary | ICD-10-CM | POA: Diagnosis not present

## 2024-09-13 DIAGNOSIS — M170 Bilateral primary osteoarthritis of knee: Secondary | ICD-10-CM | POA: Insufficient documentation

## 2024-09-13 DIAGNOSIS — M25561 Pain in right knee: Secondary | ICD-10-CM | POA: Diagnosis not present

## 2024-09-13 DIAGNOSIS — N289 Disorder of kidney and ureter, unspecified: Secondary | ICD-10-CM | POA: Diagnosis not present

## 2024-09-13 DIAGNOSIS — S3993XA Unspecified injury of pelvis, initial encounter: Secondary | ICD-10-CM | POA: Diagnosis not present

## 2024-09-13 DIAGNOSIS — M8580 Other specified disorders of bone density and structure, unspecified site: Secondary | ICD-10-CM | POA: Diagnosis not present

## 2024-09-13 DIAGNOSIS — M48061 Spinal stenosis, lumbar region without neurogenic claudication: Secondary | ICD-10-CM | POA: Diagnosis not present

## 2024-09-13 MED ORDER — OXYCODONE HCL 5 MG PO TABS
5.0000 mg | ORAL_TABLET | Freq: Once | ORAL | Status: AC
  Administered 2024-09-13: 5 mg via ORAL
  Filled 2024-09-13: qty 1

## 2024-09-13 MED ORDER — ACETAMINOPHEN 325 MG PO TABS
650.0000 mg | ORAL_TABLET | Freq: Once | ORAL | Status: AC
  Administered 2024-09-13: 650 mg via ORAL
  Filled 2024-09-13: qty 2

## 2024-09-13 MED ORDER — OXYCODONE HCL 5 MG PO TABS: 5.0000 mg | ORAL_TABLET | ORAL | 0 refills | Status: DC | PRN

## 2024-09-13 NOTE — ED Triage Notes (Signed)
 Pt is here by [facility] for bilateral knee pain.  She has hx of arthritis.

## 2024-09-13 NOTE — Discharge Instructions (Addendum)
 Follow-up with your orthopedic specialist.  Continue with your Tylenol as directed. You can take oxycodone for pain that is not controlled with your Tylenol. As discussed, this medication increases risk for falls and can cause constipation.  Recommend management of your constipation with MiraLAX or prune juice however you prefer to manage her bowels.

## 2024-09-13 NOTE — ED Notes (Signed)
 Pt ambulated w/ home walker after application of knee immobilizer. Pt x2 assist with walker, pt with unsteady gait most of ambulation. Pt verbalized fear of falling. Pt ambulated back to stretcher with steadier gait than starting gait. EDP notified.

## 2024-09-13 NOTE — ED Triage Notes (Signed)
 Pt via pov from home with bilateral knee pain that is chronic. Daughter states that she was trying to walk with her walker earlier and her knees buckled. Pt is unable to stand on her legs at this time.   Pt a& o x4; nad noted.
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
## 2024-09-13 NOTE — ED Provider Notes (Signed)
 [place] EMERGENCY DEPARTMENT AT [facility] Provider Note   CSN: [id number] Arrival date & time: 09/13/24  1646     Patient presents with: Knee Pain   Stacy Valentine is a 89 y.o. female.   89 year old female brought in by EMS from home with daughter at bedside.  Patient with known arthritis in her knees, treated by orthopedics with injections.  Patient was on her way to a doctor's appointment today, daughter was walking with her and holding onto her when her knees suddenly gave way and she nearly fell.  Patient's daughter was able to assist her and she did not actually fall to the ground.  Patient was unable to bear weight secondary to pain in her knees and EMS was called to bring her to the ER.  Last fall was about 4 months ago.  No other complaints or concerns.  Takes Tylenol for her pain, last took her Tylenol this morning.       Prior to Admission medications   Medication Sig Start Date End Date Taking? Authorizing Provider  oxyCODONE (ROXICODONE) 5 MG immediate release tablet Take 1 tablet (5 mg total) by mouth every 4 (four) hours as needed for severe pain (pain score 7-10). 09/13/24  Yes Beverley Leita LABOR, PA-C  alendronate (FOSAMAX) 70 MG tablet Take 70 mg by mouth once a week. 03/28/23   [provider]  amLODipine (NORVASC) 10 MG tablet Take 10 mg by mouth daily. 01/21/23   [provider]  atorvastatin (LIPITOR) 10 MG tablet Take 10 mg by mouth daily. 01/21/23   [provider]  Blood Glucose Monitoring Suppl (ONETOUCH VERIO FLEX SYSTEM) w/Device KIT USE AS DIRECTED ONCE DAILY IN THE MORNING TO CHECK FASTING GLUCOSE. GOAL < 130 02/04/23   [provider]  clobetasol ointment (TEMOVATE) 0.05 % SMARTSIG:Topical Twice a Week PRN 03/09/23   [provider]  ezetimibe (ZETIA) 10 MG tablet Take 10 mg by mouth daily. 01/21/23   [provider]  furosemide (LASIX) 40 MG tablet Take 40 mg by mouth daily. 01/21/23   [provider]  glipiZIDE (GLUCOTROL XL) 10 MG 24 hr tablet Take 10 mg by mouth every morning. 01/21/23   [provider]  JANUVIA 50 MG tablet Take 50 mg by mouth daily. 01/21/23   [provider]  Lancets (ONETOUCH DELICA PLUS [place]) MISC Apply topically every morning. 02/04/23   [provider]  levothyroxine (SYNTHROID) 75 MCG tablet Take 75 mcg by mouth every morning. 03/03/23   [provider]  lisinopril (ZESTRIL) 20 MG tablet Take 40 mg by mouth daily. 03/03/23   [provider]  metoprolol succinate (TOPROL-XL) 100 MG 24 hr tablet Take 100 mg by mouth daily. 03/28/23   [provider]  AISHA SINKS test strip SMARTSIG:Device Via Meter Every Morning 02/04/23   [provider]    Allergies: Penicillins    Review of Systems Negative except as per HPI Updated Vital Signs BP (!) 159/56   Pulse (!) 55   Temp 97.7 F (36.5 C) (Oral)   Resp 18   Ht 4' 10 (1.473 m)   Wt 63 kg   SpO2 96%   BMI 29.05 kg/m   Physical Exam Vitals and nursing note reviewed.  Constitutional:      General: She is not in acute distress.    Appearance: She is well-developed. She is not diaphoretic.  HENT:     Head: Normocephalic and atraumatic.  Cardiovascular:  Pulses: Normal pulses.  Pulmonary:     Effort: Pulmonary effort is normal.  Abdominal:     Palpations: Abdomen is soft.     Tenderness: There is no abdominal tenderness.  Musculoskeletal:        General: No swelling, tenderness, deformity or signs of injury.     Thoracic back: No tenderness or bony tenderness.     Lumbar back: No tenderness or bony tenderness.     Right lower leg: Edema present.     Left lower leg: Edema present.     Comments: Minimal bilateral lower extremity edema No pain with palpation of the ankles, knees, hips.  Does have some pain with range of motion of the hips and knees.  Skin:    General: Skin is warm and dry.     Findings: No erythema or rash.   Neurological:     Mental Status: She is alert and oriented to person, place, and time.     Sensory: No sensory deficit.     Motor: No weakness.  Psychiatric:        Behavior: Behavior normal.     (all labs ordered are listed, but only abnormal results are displayed) Labs Reviewed - No data to display  EKG: None  Radiology: CT PELVIS WO CONTRAST Result Date: 09/13/2024 CLINICAL DATA:  Trauma EXAM: CT PELVIS WITHOUT CONTRAST TECHNIQUE: Multidetector CT imaging of the pelvis was performed following the standard protocol without intravenous contrast. RADIATION DOSE REDUCTION: This exam was performed according to the departmental dose-optimization program which includes automated exposure control, adjustment of the mA and/or kV according to patient size and/or use of iterative reconstruction technique. COMPARISON:  None Available. FINDINGS: Urinary Tract: There is a probable right inferior pole renal cyst which is partially imaged measuring up to 5.6 cm. The bladder is within normal limits. Bowel: Unremarkable visualized pelvic bowel loops. There is sigmoid colon diverticulosis. Vascular/Lymphatic: No pathologically enlarged lymph nodes. No significant vascular abnormality seen. Reproductive:  The uterus is surgically absent. Other: There is no ascites. There is a small fat containing right inguinal hernia. There is minimal body wall edema. Musculoskeletal: The bones are diffusely osteopenic. There is no acute fracture or dislocation identified. The bones are diffusely osteopenic. There are moderate degenerative changes of the hips and pubic symphysis with joint space narrowing and osteophyte formation. There is no focal body wall hematoma. IMPRESSION: 1. No acute fracture or dislocation. 2. Moderate degenerative changes of the hips and pubic symphysis. 3. Minimal body wall edema. 4. Small fat containing right inguinal hernia. 5. Sigmoid colon diverticulosis. Electronically Signed   By: Greig Pique  M.D.   On: 09/13/2024 20:50   DG Pelvis 1-2 Views Result Date: 09/13/2024 CLINICAL DATA:  Pain EXAM: PELVIS - 1-2 VIEW COMPARISON:  None Available. FINDINGS: There is no evidence of pelvic fracture or diastasis. No pelvic bone lesions are seen. The bones are diffusely osteopenic. There are moderate degenerative changes of both hips with joint space narrowing and osteophyte formation. There are mild degenerative changes of the sacroiliac joint and pubic symphysis. IMPRESSION: 1. No acute fracture or dislocation. 2. Moderate degenerative changes of both hips. Electronically Signed   By: Greig Pique M.D.   On: 09/13/2024 18:42   DG Lumbar Spine Complete Result Date: 09/13/2024 CLINICAL DATA:  Pain EXAM: LUMBAR SPINE - COMPLETE 4+ VIEW COMPARISON:  None Available. FINDINGS: There is no evidence of lumbar spine fracture. Alignment is normal. There is moderate severe disc space narrowing and osteophyte formation  throughout all levels of the visualized lower thoracic and lumbar spine. There are atherosclerotic calcifications of the aorta. IMPRESSION: Moderate to severe multilevel degenerative disc disease. Electronically Signed   By: Greig Pique M.D.   On: 09/13/2024 18:41   DG Knee Complete 4 Views Left Result Date: 09/13/2024 CLINICAL DATA:  Pain EXAM: DG KNEE COMPLETE 4+V*L* COMPARISON:  None Available. FINDINGS: There is no acute fracture or dislocation. There is severe medial compartment and patellofemoral compartment joint space narrowing. There is severe tricompartmental osteophyte formation. No significant joint effusion. IMPRESSION: Severe tricompartmental osteoarthritis. Electronically Signed   By: Greig Pique M.D.   On: 09/13/2024 18:40   DG Knee Complete 4 Views Right Result Date: 09/13/2024 CLINICAL DATA:  Pain EXAM: RIGHT KNEE - COMPLETE 4+ VIEW COMPARISON:  None Available. FINDINGS: There is no acute fracture or dislocation. There is no significant joint effusion. There is  tricompartmental moderate degenerative change with tricompartmental osteophyte formation most significant in the lateral compartment. There is moderate patellofemoral compartment joint space narrowing. IMPRESSION: Moderate tricompartmental degenerative change. Electronically Signed   By: Greig Pique M.D.   On: 09/13/2024 18:39     Procedures   Medications Ordered in the ED  acetaminophen (TYLENOL) tablet 650 mg (650 mg Oral Given 09/13/24 1805)  oxyCODONE (Oxy IR/ROXICODONE) immediate release tablet 5 mg (5 mg Oral Given 09/13/24 1855)                                    Medical Decision Making Amount and/or Complexity of Data Reviewed Radiology: ordered.  Risk OTC drugs. Prescription drug management.   This patient presents to the ED for concern of leg pain, this involves an extensive number of treatment options, and is a complaint that carries with it a high risk of complications and morbidity.  The differential diagnosis includes but not limited to fracture, dislocation, contusion, strain, arthritis    Co morbidities / Chronic conditions that complicate the patient evaluation  Diabetes, osteoarthritis   Additional history obtained:  Additional history obtained from EMR External records from outside source obtained and reviewed including prior records on file    Imaging Studies ordered:  I ordered imaging studies including XR bilateral knees, pelvis, CT pelvis, XR lumbar spine  I independently visualized and interpreted imaging which showed arthritic changes (severe)  I agree with the radiologist interpretation   Problem List / ED Course / Critical interventions / Medication management  89 year old female brought in by EMS from home with complaint of pain in her knees and hips.  History of severe osteoarthritic arthritis, managed with injections by orthopedics.  Today, patient was ambulating, her daughter holds onto her while she walks, her right knee has significant  valgus deformity and causes her knee to give out on her.  Her daughter caught her and prevented a fall today but presented with severe pain.  Imaging obtained does not show any acute injury.  CT pelvis negative for occult fracture.  Patient's daughter inquires if a knee immobilizer placed on the right knee lessening the valgus deformity would help her ambulate better.  Plan is to place knee immobilizer and trial ambulation.  If successful, can discharge home.  Provided with prescription for oxycodone to take if her Tylenol does not control her pain.  Discussed fall risk and constipation concerns with patient and her daughter who verbalized understanding.  Face-to-face completed to help with in-home health services.  Patient  does have a ramp at home to help her get into the house, a prescription for a wheelchair was sent to patient's pharmacy. I ordered medication including tylenol, oxycodone    Reevaluation of the patient after these medicines showed that the patient pain somewhat improved  I have reviewed the patients home medicines and have made adjustments as needed  Social Determinants of Health:  Lives with daughter   Test / Admission - Considered:  Plan is for dc home via [facility] if able to ambulate with walker       Final diagnoses:  Osteoarthritis of both knees, unspecified osteoarthritis type  Osteoarthritis of both hips, unspecified osteoarthritis type  Old age    ED Discharge Orders          Ordered    oxyCODONE (ROXICODONE) 5 MG immediate release tablet  Every 4 hours PRN        09/13/24 2120    For home use only DME standard manual wheelchair with seat cushion       Comments: Patient suffers from severe osteoarthritis which impairs their ability to perform daily activities like bathing, toileting, feeding, grooming, dressing in the home.  A cane, crutch, or walker will not resolve issue with performing activities of daily living. A wheelchair will allow patient to safely  perform daily activities. Patient can safely propel the wheelchair in the home or has a caregiver who can provide assistance. Length of need Lifetime. Accessories: elevating leg rests (ELRs), wheel locks, extensions and anti-tippers.   09/13/24 2133               Beverley Leita LABOR, PA-C 09/13/24 2138    Simon Lavonia SAILOR, MD 09/14/24 [phone]

## 2024-09-13 NOTE — ED Provider Notes (Signed)
  Physical Exam  BP (!) 170/61   Pulse 62   Temp 97.7 F (36.5 C) (Oral)   Resp 20   Ht 4' 10 (1.473 m)   Wt 63 kg   SpO2 95%   BMI 29.05 kg/m   Physical Exam Cardiovascular:     Rate and Rhythm: Normal rate and regular rhythm.  Pulmonary:     Effort: Pulmonary effort is normal.     Breath sounds: Normal breath sounds.  Abdominal:     General: Abdomen is flat. Bowel sounds are normal.     Palpations: Abdomen is soft.     Procedures  Procedures  ED Course / MDM    Medical Decision Making Amount and/or Complexity of Data Reviewed Radiology: ordered.  Risk OTC drugs. Prescription drug management.    Patient was received in signout from prior ED PA Leita at shift change.  Please review note for further HPI.  In short patient is here with chronic knee pain worsening today.  Her discharge is ultimately pending ambulation trial.  Patient was ambulatory here with walker and knee immobilizer.  The patient does report she continues to be nervous about ambulating however the daughter does not wish for SNF placement/hospital stay at this time and prefers to be discharged home for face-to-face and home health services.  She feels comfortable taking her mother home and is requesting to be discharged.  Patient is hemodynamically stable and I feel she is appropriate discharge at this time.      Shermon Warren SAILOR, PA-C 09/13/24 2304    Simon Lavonia SAILOR, MD 09/14/24 [phone]

## 2024-09-13 NOTE — ED Notes (Signed)
 Pt d/c instructions, medications, and follow-up care reviewed with pt and daughter. Pt and daughter verbalized understanding and had no further questions at time of d/c. Pt CA&Ox4 and in NAD at time of d/c

## 2024-09-17 ENCOUNTER — Emergency Department (HOSPITAL_COMMUNITY)
Admission: EM | Admit: 2024-09-17 | Discharge: 2024-09-18 | Disposition: A | Discharge status: Home / Self Care | Attending: Emergency Medicine | Admitting: Emergency Medicine

## 2024-09-17 ENCOUNTER — Emergency Department (HOSPITAL_COMMUNITY)

## 2024-09-17 ENCOUNTER — Encounter (HOSPITAL_COMMUNITY): Payer: Self-pay | Admitting: *Deleted

## 2024-09-17 DIAGNOSIS — Z7984 Long term (current) use of oral hypoglycemic drugs: Secondary | ICD-10-CM | POA: Insufficient documentation

## 2024-09-17 DIAGNOSIS — R404 Transient alteration of awareness: Secondary | ICD-10-CM | POA: Diagnosis not present

## 2024-09-17 DIAGNOSIS — R442 Other hallucinations: Secondary | ICD-10-CM | POA: Diagnosis not present

## 2024-09-17 DIAGNOSIS — I10 Essential (primary) hypertension: Secondary | ICD-10-CM | POA: Diagnosis not present

## 2024-09-17 DIAGNOSIS — E119 Type 2 diabetes mellitus without complications: Secondary | ICD-10-CM | POA: Diagnosis not present

## 2024-09-17 DIAGNOSIS — R410 Disorientation, unspecified: Secondary | ICD-10-CM | POA: Diagnosis not present

## 2024-09-17 LAB — CBC WITH DIFFERENTIAL/PLATELET
Abs Immature Granulocytes: 0.03 K/uL (ref 0.00–0.07)
Basophils Absolute: 0 K/uL (ref 0.0–0.1)
Basophils Relative: 1 %
Eosinophils Absolute: 0.1 K/uL (ref 0.0–0.5)
Eosinophils Relative: 1 %
HCT: 35 % — ABNORMAL LOW (ref 36.0–46.0)
Hemoglobin: 12 g/dL (ref 12.0–15.0)
Immature Granulocytes: 0 %
Lymphocytes Relative: 20 %
Lymphs Abs: 1.4 K/uL (ref 0.7–4.0)
MCH: 31.7 pg (ref 26.0–34.0)
MCHC: 34.3 g/dL (ref 30.0–36.0)
MCV: 92.3 fL (ref 80.0–100.0)
Monocytes Absolute: 0.8 K/uL (ref 0.1–1.0)
Monocytes Relative: 12 %
Neutro Abs: 4.8 K/uL (ref 1.7–7.7)
Neutrophils Relative %: 66 %
Platelets: 207 K/uL (ref 150–400)
RBC: 3.79 MIL/uL — ABNORMAL LOW (ref 3.87–5.11)
RDW: 13.1 % (ref 11.5–15.5)
WBC: 7.2 K/uL (ref 4.0–10.5)
nRBC: 0 % (ref 0.0–0.2)

## 2024-09-17 LAB — URINALYSIS, ROUTINE W REFLEX MICROSCOPIC
Bilirubin Urine: NEGATIVE
Glucose, UA: 50 mg/dL — AB
Hgb urine dipstick: NEGATIVE
Ketones, ur: NEGATIVE mg/dL
Nitrite: NEGATIVE
Protein, ur: >=300 mg/dL — AB
Specific Gravity, Urine: 1.009 (ref 1.005–1.030)
pH: 5 (ref 5.0–8.0)

## 2024-09-17 LAB — COMPREHENSIVE METABOLIC PANEL WITH GFR
ALT: 8 U/L (ref 0–44)
AST: 22 U/L (ref 15–41)
Albumin: 3.3 g/dL — ABNORMAL LOW (ref 3.5–5.0)
Alkaline Phosphatase: 66 U/L (ref 38–126)
Anion gap: 12 (ref 5–15)
BUN: 29 mg/dL — ABNORMAL HIGH (ref 8–23)
CO2: 21 mmol/L — ABNORMAL LOW (ref 22–32)
Calcium: 9.4 mg/dL (ref 8.9–10.3)
Chloride: 104 mmol/L (ref 98–111)
Creatinine, Ser: 1.29 mg/dL — ABNORMAL HIGH (ref 0.44–1.00)
GFR, Estimated: 38 mL/min — ABNORMAL LOW (ref >60)
Glucose, Bld: 212 mg/dL — ABNORMAL HIGH (ref 70–99)
Potassium: 4.4 mmol/L (ref 3.5–5.1)
Sodium: 137 mmol/L (ref 135–145)
Total Bilirubin: 1.3 mg/dL — ABNORMAL HIGH (ref 0.0–1.2)
Total Protein: 6.5 g/dL (ref 6.5–8.1)

## 2024-09-17 LAB — CBG MONITORING, ED: Glucose-Capillary: 219 mg/dL — ABNORMAL HIGH (ref 70–99)

## 2024-09-17 MED ORDER — LACTATED RINGERS IV BOLUS
1000.0000 mL | Freq: Once | INTRAVENOUS | Status: AC
  Administered 2024-09-17: 1000 mL via INTRAVENOUS

## 2024-09-17 NOTE — Discharge Instructions (Addendum)
 I discussed the plan for discharge with the patient and/or their surrogate at bedside prior to discharge and they were in agreement with the plan and verbalized understanding of the return precautions provided. All questions answered to the best of my ability. Ultimately, the patient was discharged in stable condition with stable vital signs. I am reassured that they are capable of close follow up and good social support at home.   I would recommend no further dosages of oxycodone .  Recommend follow-up with PCP tomorrow.

## 2024-09-17 NOTE — ED Provider Triage Note (Signed)
 Emergency Medicine Provider Triage Evaluation Note  Stacy Valentine , a 89 y.o. female  was evaluated in triage.  Pt complains of confusion. Pt lives with daughter.  Daughter noticed pt have been mistakenly her with the other daughter.  She also haven't been eating/drink much today.  Pt does have hx of hallucination due to [employer] Syndrome.  Pt currently voice concerns of her elevated BP but denies headache, cp, sob, abd pain or urinary sxs.  Denies focal numbness or focal weakness  Review of Systems  Positive: As above Negative: As above  Physical Exam  BP (!) 197/74 (BP Location: Right Arm)   Pulse 64   Temp 98.2 F (36.8 C) (Oral)   Resp 15   Ht 4' 10 (1.473 m)   Wt 63 kg   SpO2 98%   BMI 29.05 kg/m  Gen:   Awake, no distress   Resp:  Normal effort  MSK:   Moves extremities without difficulty  Other:    Medical Decision Making  Medically screening exam initiated at 8:17 PM.  Appropriate orders placed.  Stacy Valentine was informed that the remainder of the evaluation will be completed by another provider, this initial triage assessment does not replace that evaluation, and the importance of remaining in the ED until their evaluation is complete.     Stacy Colon, PA-C 09/17/24 2019

## 2024-09-17 NOTE — ED Provider Notes (Signed)
 [place] EMERGENCY DEPARTMENT AT [facility] Provider Note   CSN: [id number] Arrival date & time: 09/17/24  2000     Patient presents with: Altered Mental Status   Stacy Valentine is a 89 y.o. female. Hx of Carlin Abrahams syndrome, T2DM presenting with delirium and worsening hallucinations.  History per patient and family.  Reports today, patient has been having difficulty remembering things, and making statements such that she wants to go home while she is currently in her home.  She has been forgetting who her daughter is, and mixing up her children which is different from her baseline.  She has also been responding to external stimulus, which is similar to patient's baseline as she generally will see hallucinations and start responding to them intermittently.  Patient has been also having reduced p.o. intake over the past few days, has had a small amount of food today, much less from prior per discussion with patient family.  Patient states that she feels well, and she denies any pain.  Denies any fever or chills, patient family denies nausea or vomiting, patient denies chest pain, shortness of breath, abdominal pain.  Family denies any episodes of diarrhea.  {Add pertinent medical, surgical, social history, OB history to YEP:67052}  Altered Mental Status      Prior to Admission medications   Medication Sig Start Date End Date Taking? Authorizing Provider  alendronate (FOSAMAX) 70 MG tablet Take 70 mg by mouth once a week. 03/28/23   [provider]  amLODipine (NORVASC) 10 MG tablet Take 10 mg by mouth daily. 01/21/23   [provider]  atorvastatin (LIPITOR) 10 MG tablet Take 10 mg by mouth daily. 01/21/23   [provider]  Blood Glucose Monitoring Suppl (ONETOUCH VERIO FLEX SYSTEM) w/Device KIT USE AS DIRECTED ONCE DAILY IN THE MORNING TO CHECK FASTING GLUCOSE. GOAL < 130 02/04/23   [provider]  clobetasol ointment (TEMOVATE) 0.05 %  SMARTSIG:Topical Twice a Week PRN 03/09/23   [provider]  ezetimibe (ZETIA) 10 MG tablet Take 10 mg by mouth daily. 01/21/23   [provider]  furosemide (LASIX) 40 MG tablet Take 40 mg by mouth daily. 01/21/23   [provider]  glipiZIDE (GLUCOTROL XL) 10 MG 24 hr tablet Take 10 mg by mouth every morning. 01/21/23   [provider]  JANUVIA 50 MG tablet Take 50 mg by mouth daily. 01/21/23   [provider]  Lancets (ONETOUCH DELICA PLUS [place]) MISC Apply topically every morning. 02/04/23   [provider]  levothyroxine (SYNTHROID) 75 MCG tablet Take 75 mcg by mouth every morning. 03/03/23   [provider]  lisinopril (ZESTRIL) 20 MG tablet Take 40 mg by mouth daily. 03/03/23   [provider]  metoprolol succinate (TOPROL-XL) 100 MG 24 hr tablet Take 100 mg by mouth daily. 03/28/23   [provider]  AISHA SINKS test strip SMARTSIG:Device Via Meter Every Morning 02/04/23   [provider]  oxyCODONE  (ROXICODONE ) 5 MG immediate release tablet Take 1 tablet (5 mg total) by mouth every 4 (four) hours as needed for severe pain (pain score 7-10). 09/13/24   Beverley Leita LABOR, PA-C    Allergies: Penicillins    Review of Systems  Updated Vital Signs BP (!) 197/74 (BP Location: Right Arm)   Pulse 64   Temp 98.2 F (36.8 C) (Oral)   Resp 15   Ht 4' 10 (1.473 m)   Wt 63 kg   SpO2 98%  BMI 29.05 kg/m   Physical Exam Vitals and nursing note reviewed.  Constitutional:      General: She is not in acute distress.    Appearance: She is well-developed.  HENT:     Head: Normocephalic and atraumatic.  Eyes:     Conjunctiva/sclera: Conjunctivae normal.  Cardiovascular:     Rate and Rhythm: Normal rate and regular rhythm.     Heart sounds: No murmur heard. Pulmonary:     Effort: Pulmonary effort is normal. No respiratory distress.     Breath sounds: Normal breath sounds.  Abdominal:     Palpations:  Abdomen is soft.     Tenderness: There is no abdominal tenderness.  Musculoskeletal:        General: No swelling.     Cervical back: Neck supple.     Comments: Right leg is in a knee immobilizer, been in place for 5 days per patient family request.  Skin:    General: Skin is warm and dry.     Capillary Refill: Capillary refill takes less than 2 seconds.  Neurological:     General: No focal deficit present.     Mental Status: She is alert.     Cranial Nerves: No cranial nerve deficit.     Motor: No weakness.     Comments: Moving all extremities without difficulty.  Cranial nerves II through XII intact.  Patient will intermittently respond to external stimulus, and start talking about the numbers that she is reading off the ceiling.  Patient is alert and oriented to self, able to state that she is in the hospital.  Patient is unsure of month.  Psychiatric:        Mood and Affect: Mood normal.     (all labs ordered are listed, but only abnormal results are displayed) Labs Reviewed  CBC WITH DIFFERENTIAL/PLATELET - Abnormal; Notable for the following components:      Result Value   RBC 3.79 (*)    HCT 35.0 (*)    All other components within normal limits  CBG MONITORING, ED - Abnormal; Notable for the following components:   Glucose-Capillary 219 (*)    All other components within normal limits  COMPREHENSIVE METABOLIC PANEL WITH GFR  URINALYSIS, ROUTINE W REFLEX MICROSCOPIC    EKG: None  Radiology: No results found.  {Document cardiac monitor, telemetry assessment procedure when appropriate:32947} Procedures   Medications Ordered in the ED - No data to display    {Click here for ABCD2, HEART and other calculators REFRESH Note before signing:1}                              Medical Decision Making  ***  {Document critical care time when appropriate  Document review of labs and clinical decision tools ie CHADS2VASC2, etc  Document your independent review of radiology  images and any outside records  Document your discussion with family members, caretakers and with consultants  Document social determinants of health affecting pt's care  Document your decision making why or why not admission, treatments were needed:32947:::1}   Final diagnoses:  None    ED Discharge Orders     None

## 2024-09-17 NOTE — ED Triage Notes (Signed)
 Pt here via [facility] from home where she lives with her daughter.  Daughter states decreased po intake today and that the pt did not recognize her - pt mistook her daughter that she lives with for another daughter.  Pt often has hallucinations d/t Carlin Abrahams Syndrome, but mistaking one child for another is something new.   Pt is AO x 4 presently.

## 2024-09-19 DIAGNOSIS — R410 Disorientation, unspecified: Secondary | ICD-10-CM | POA: Diagnosis not present

## 2024-10-05 DIAGNOSIS — L570 Actinic keratosis: Secondary | ICD-10-CM | POA: Diagnosis not present

## 2024-10-05 DIAGNOSIS — X32XXXD Exposure to sunlight, subsequent encounter: Secondary | ICD-10-CM | POA: Diagnosis not present

## 2024-10-05 DIAGNOSIS — L820 Inflamed seborrheic keratosis: Secondary | ICD-10-CM | POA: Diagnosis not present

## 2024-11-04 ENCOUNTER — Emergency Department (HOSPITAL_COMMUNITY)

## 2024-11-04 ENCOUNTER — Inpatient Hospital Stay (HOSPITAL_COMMUNITY)
Admission: EM | Admit: 2024-11-04 | Discharge: 2024-11-09 | DRG: 689 | Disposition: A | Discharge status: Home Health | Attending: Internal Medicine | Admitting: Internal Medicine

## 2024-11-04 ENCOUNTER — Encounter (HOSPITAL_COMMUNITY): Payer: Self-pay | Admitting: Emergency Medicine

## 2024-11-04 ENCOUNTER — Other Ambulatory Visit: Payer: Self-pay

## 2024-11-04 DIAGNOSIS — E785 Hyperlipidemia, unspecified: Secondary | ICD-10-CM | POA: Diagnosis present

## 2024-11-04 DIAGNOSIS — Z79890 Hormone replacement therapy (postmenopausal): Secondary | ICD-10-CM

## 2024-11-04 DIAGNOSIS — N390 Urinary tract infection, site not specified: Principal | ICD-10-CM | POA: Diagnosis present

## 2024-11-04 DIAGNOSIS — Z7983 Long term (current) use of bisphosphonates: Secondary | ICD-10-CM

## 2024-11-04 DIAGNOSIS — E1165 Type 2 diabetes mellitus with hyperglycemia: Secondary | ICD-10-CM | POA: Diagnosis present

## 2024-11-04 DIAGNOSIS — Z79899 Other long term (current) drug therapy: Secondary | ICD-10-CM

## 2024-11-04 DIAGNOSIS — E1122 Type 2 diabetes mellitus with diabetic chronic kidney disease: Secondary | ICD-10-CM | POA: Diagnosis present

## 2024-11-04 DIAGNOSIS — N185 Chronic kidney disease, stage 5: Secondary | ICD-10-CM | POA: Diagnosis present

## 2024-11-04 DIAGNOSIS — E039 Hypothyroidism, unspecified: Secondary | ICD-10-CM | POA: Diagnosis present

## 2024-11-04 DIAGNOSIS — Z7984 Long term (current) use of oral hypoglycemic drugs: Secondary | ICD-10-CM

## 2024-11-04 DIAGNOSIS — I10 Essential (primary) hypertension: Secondary | ICD-10-CM | POA: Diagnosis present

## 2024-11-04 DIAGNOSIS — E860 Dehydration: Secondary | ICD-10-CM | POA: Diagnosis present

## 2024-11-04 DIAGNOSIS — F0392 Unspecified dementia, unspecified severity, with psychotic disturbance: Secondary | ICD-10-CM | POA: Diagnosis present

## 2024-11-04 DIAGNOSIS — R4182 Altered mental status, unspecified: Principal | ICD-10-CM

## 2024-11-04 DIAGNOSIS — G9341 Metabolic encephalopathy: Secondary | ICD-10-CM | POA: Diagnosis present

## 2024-11-04 DIAGNOSIS — A419 Sepsis, unspecified organism: Secondary | ICD-10-CM | POA: Diagnosis present

## 2024-11-04 DIAGNOSIS — R1310 Dysphagia, unspecified: Secondary | ICD-10-CM | POA: Diagnosis present

## 2024-11-04 DIAGNOSIS — J849 Interstitial pulmonary disease, unspecified: Secondary | ICD-10-CM | POA: Diagnosis present

## 2024-11-04 DIAGNOSIS — Z7409 Other reduced mobility: Secondary | ICD-10-CM | POA: Diagnosis present

## 2024-11-04 DIAGNOSIS — I120 Hypertensive chronic kidney disease with stage 5 chronic kidney disease or end stage renal disease: Secondary | ICD-10-CM | POA: Diagnosis present

## 2024-11-04 DIAGNOSIS — E119 Type 2 diabetes mellitus without complications: Secondary | ICD-10-CM

## 2024-11-04 DIAGNOSIS — M810 Age-related osteoporosis without current pathological fracture: Secondary | ICD-10-CM | POA: Diagnosis present

## 2024-11-04 DIAGNOSIS — G9340 Encephalopathy, unspecified: Secondary | ICD-10-CM | POA: Diagnosis present

## 2024-11-04 LAB — CBC WITH DIFFERENTIAL/PLATELET
Abs Immature Granulocytes: 0.03 K/uL (ref 0.00–0.07)
Basophils Absolute: 0 K/uL (ref 0.0–0.1)
Basophils Relative: 1 %
Eosinophils Absolute: 0 K/uL (ref 0.0–0.5)
Eosinophils Relative: 0 %
HCT: 35.5 % — ABNORMAL LOW (ref 36.0–46.0)
Hemoglobin: 12.1 g/dL (ref 12.0–15.0)
Immature Granulocytes: 0 %
Lymphocytes Relative: 26 %
Lymphs Abs: 1.9 K/uL (ref 0.7–4.0)
MCH: 31.5 pg (ref 26.0–34.0)
MCHC: 34.1 g/dL (ref 30.0–36.0)
MCV: 92.4 fL (ref 80.0–100.0)
Monocytes Absolute: 0.7 K/uL (ref 0.1–1.0)
Monocytes Relative: 10 %
Neutro Abs: 4.4 K/uL (ref 1.7–7.7)
Neutrophils Relative %: 63 %
Platelets: 227 K/uL (ref 150–400)
RBC: 3.84 MIL/uL — ABNORMAL LOW (ref 3.87–5.11)
RDW: 13.2 % (ref 11.5–15.5)
WBC: 7.1 K/uL (ref 4.0–10.5)
nRBC: 0 % (ref 0.0–0.2)

## 2024-11-04 NOTE — ED Triage Notes (Signed)
 BIB EMS from home.  Normally alert and oriented.  Today alert and oriented x 2.  Hallucinating things on her hands etc.

## 2024-11-04 NOTE — ED Provider Notes (Signed)
 " [facility] Emergency Department Provider Note MRN:  [id number]  Arrival date & time: 11/05/2024     Chief Complaint   Altered Mental Status   History of Present Illness   Stacy Valentine is a 89 y.o. year-old female presents to the ED with chief complaint of AMS.  Family reports that patient has been acutely confused that started this evening.  Patient is normally able to care for self.  Has had decreased appetite.  No reported fever or cold symptoms.  Family states that she might have aspirated some water last night.  Denies any new pain.  Hx provided by family.   Review of Systems  Pertinent positive and negative review of systems noted in HPI.    Physical Exam   Vitals:   11/05/24 0135 11/05/24 0200  BP:  (!) 154/80  Pulse: 75 78  Resp:    Temp: 97.7 F (36.5 C)   SpO2: 99% 99%    CONSTITUTIONAL:  non toxic-appearing, NAD NEURO:  Alert and oriented x 3, CN 3-12 grossly intact EYES:  eyes equal and reactive ENT/NECK:  Supple, no stridor  CARDIO:  normal rate, regular rhythm, appears well-perfused  PULM:  No respiratory distress, CTAB GI/GU:  non-distended,  MSK/SPINE:  No gross deformities, no edema, moves all extremities  SKIN:  no rash, atraumatic PSYCH: actively hallucinating   *Additional and/or pertinent findings included in MDM below  Diagnostic and Interventional Summary    EKG Interpretation Date/Time:    Ventricular Rate:    PR Interval:    QRS Duration:    QT Interval:    QTC Calculation:   R Axis:      Text Interpretation:         Labs Reviewed  COMPREHENSIVE METABOLIC PANEL WITH GFR - Abnormal; Notable for the following components:      Result Value   CO2 20 (*)    Glucose, Bld 126 (*)    BUN 28 (*)    Creatinine, Ser 1.18 (*)    Total Protein 6.4 (*)    GFR, Estimated 42 (*)    All other components within normal limits  CBC WITH DIFFERENTIAL/PLATELET - Abnormal; Notable for the following components:    RBC 3.84 (*)    HCT 35.5 (*)    All other components within normal limits  URINALYSIS, ROUTINE W REFLEX MICROSCOPIC - Abnormal; Notable for the following components:   Color, Urine STRAW (*)    Glucose, UA 50 (*)    Protein, ur >=300 (*)    Leukocytes,Ua TRACE (*)    Bacteria, UA RARE (*)    All other components within normal limits  RESP PANEL BY RT-PCR (RSV, FLU A&B, COVID)  [id number]  CBG MONITORING, ED    CT HEAD WO CONTRAST ( )  Final Result    DG Chest Port 1 View  Final Result      Medications  hydrALAZINE  (APRESOLINE ) injection 20 mg (20 mg Intravenous Given 11/05/24 0103)     Procedures  /  Critical Care Procedures  ED Course and Medical Decision Making  I have reviewed the triage vital signs, the nursing notes, and pertinent available records from the EMR.  Social Determinants Affecting Complexity of Care: Patient has no clinically significant social determinants affecting this chief complaint..   ED Course:    Medical Decision Making Patient here with acute encephalopathy.  Brought in by family members after she was hallucinating and confused at home today.  Onset of symptoms was  yesterday afternoon.  Family members deny any fever, new cough, or pain symptoms.  Family reports that patient is normally able to perform all ADLs and is ANO x 4.  Patient denies not recognizing her family members and has been confused and hallucinating.  Laboratory workup is fairly reassuring.  Normal CBG.  COVID and flu are negative.  Urinalysis is abnormal, but not completely suggestive of UTI.  She does not have any significant electrolyte abnormalities.  Reassuring CBC.  CT head is negative for ICH.  Uncertain cause of the patient's acute confusion/delirium.  Will consult hospitalist for further observation and management.  Of note, patient has had something similar in the past, but it was attributed to oxycodone  use.  Patient is not currently taking oxycodone .  Amount and/or  Complexity of Data Reviewed Labs: ordered. Radiology: ordered.  Risk Prescription drug management. Decision regarding hospitalization.         Consultants: I consulted with Hospitalist, Dr. Dena, who is appreciated for admitting.   Treatment and Plan: Patient's exam and diagnostic results are concerning for acute encephalopathy/delirium.  Feel that patient will need admission to the hospital for further treatment and evaluation.  Patient discussed with attending physician, Dr. Trine, who agrees with plan.  Final Clinical Impressions(s) / ED Diagnoses     ICD-10-CM   1. Altered mental status, unspecified altered mental status type  R41.82       ED Discharge Orders     None         Discharge Instructions Discussed with and Provided to Patient:   Discharge Instructions   None      Vicky Charleston, PA-C 11/05/24 0405    Trine Raynell Moder, MD 11/05/24 [phone]  "
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
## 2024-11-05 ENCOUNTER — Observation Stay (HOSPITAL_COMMUNITY)

## 2024-11-05 ENCOUNTER — Encounter (HOSPITAL_COMMUNITY): Payer: Self-pay | Admitting: Internal Medicine

## 2024-11-05 DIAGNOSIS — I10 Essential (primary) hypertension: Secondary | ICD-10-CM | POA: Diagnosis present

## 2024-11-05 DIAGNOSIS — A419 Sepsis, unspecified organism: Secondary | ICD-10-CM | POA: Diagnosis present

## 2024-11-05 DIAGNOSIS — G9340 Encephalopathy, unspecified: Secondary | ICD-10-CM | POA: Diagnosis not present

## 2024-11-05 DIAGNOSIS — E039 Hypothyroidism, unspecified: Secondary | ICD-10-CM | POA: Diagnosis present

## 2024-11-05 DIAGNOSIS — E785 Hyperlipidemia, unspecified: Secondary | ICD-10-CM | POA: Diagnosis present

## 2024-11-05 LAB — COMPREHENSIVE METABOLIC PANEL WITH GFR
ALT: <5 U/L (ref 0–44)
AST: 21 U/L (ref 15–41)
Albumin: 3.5 g/dL (ref 3.5–5.0)
Alkaline Phosphatase: 60 U/L (ref 38–126)
Anion gap: 12 (ref 5–15)
BUN: 28 mg/dL — ABNORMAL HIGH (ref 8–23)
CO2: 20 mmol/L — ABNORMAL LOW (ref 22–32)
Calcium: 10.1 mg/dL (ref 8.9–10.3)
Chloride: 107 mmol/L (ref 98–111)
Creatinine, Ser: 1.18 mg/dL — ABNORMAL HIGH (ref 0.44–1.00)
GFR, Estimated: 42 mL/min — ABNORMAL LOW
Glucose, Bld: 126 mg/dL — ABNORMAL HIGH (ref 70–99)
Potassium: 4 mmol/L (ref 3.5–5.1)
Sodium: 138 mmol/L (ref 135–145)
Total Bilirubin: 0.9 mg/dL (ref 0.0–1.2)
Total Protein: 6.4 g/dL — ABNORMAL LOW (ref 6.5–8.1)

## 2024-11-05 LAB — RESP PANEL BY RT-PCR (RSV, FLU A&B, COVID)  RVPGX2
Influenza A by PCR: NEGATIVE
Influenza B by PCR: NEGATIVE
Resp Syncytial Virus by PCR: NEGATIVE
SARS Coronavirus 2 by RT PCR: NEGATIVE

## 2024-11-05 LAB — URINALYSIS, ROUTINE W REFLEX MICROSCOPIC
Bilirubin Urine: NEGATIVE
Glucose, UA: 50 mg/dL — AB
Hgb urine dipstick: NEGATIVE
Ketones, ur: NEGATIVE mg/dL
Nitrite: NEGATIVE
Protein, ur: >=300 mg/dL — AB
Specific Gravity, Urine: 1.007 (ref 1.005–1.030)
pH: 6 (ref 5.0–8.0)

## 2024-11-05 LAB — LACTIC ACID, PLASMA
Lactic Acid, Venous: 1 mmol/L (ref 0.5–1.9)
Lactic Acid, Venous: 0.7 mmol/L (ref 0.5–1.9)

## 2024-11-05 LAB — PHOSPHORUS: Phosphorus: 3.1 mg/dL (ref 2.5–4.6)

## 2024-11-05 LAB — FOLATE: Folate: 12.4 ng/mL

## 2024-11-05 LAB — C-REACTIVE PROTEIN: CRP: <0.5 mg/dL

## 2024-11-05 LAB — MAGNESIUM: Magnesium: 2.3 mg/dL (ref 1.7–2.4)

## 2024-11-05 LAB — CREATININE, SERUM
Creatinine, Ser: 1.14 mg/dL — ABNORMAL HIGH (ref 0.44–1.00)
GFR, Estimated: 44 mL/min — ABNORMAL LOW

## 2024-11-05 LAB — CBC
HCT: 34.8 % — ABNORMAL LOW (ref 36.0–46.0)
Hemoglobin: 11.8 g/dL — ABNORMAL LOW (ref 12.0–15.0)
MCH: 31.3 pg (ref 26.0–34.0)
MCHC: 33.9 g/dL (ref 30.0–36.0)
MCV: 92.3 fL (ref 80.0–100.0)
Platelets: 234 K/uL (ref 150–400)
RBC: 3.77 MIL/uL — ABNORMAL LOW (ref 3.87–5.11)
RDW: 13.2 % (ref 11.5–15.5)
WBC: 10.5 K/uL (ref 4.0–10.5)
nRBC: 0 % (ref 0.0–0.2)

## 2024-11-05 LAB — SEDIMENTATION RATE: Sed Rate: 18 mm/h (ref 0–22)

## 2024-11-05 LAB — PROTIME-INR
INR: 1 (ref 0.8–1.2)
Prothrombin Time: 14 s (ref 11.4–15.2)

## 2024-11-05 LAB — HEMOGLOBIN A1C
Hgb A1c MFr Bld: 6.2 % — ABNORMAL HIGH (ref 4.8–5.6)
Mean Plasma Glucose: 131.24 mg/dL

## 2024-11-05 LAB — CBG MONITORING, ED
Glucose-Capillary: 194 mg/dL — ABNORMAL HIGH (ref 70–99)
Glucose-Capillary: 154 mg/dL — ABNORMAL HIGH (ref 70–99)

## 2024-11-05 LAB — GLUCOSE, CAPILLARY
Glucose-Capillary: 108 mg/dL — ABNORMAL HIGH (ref 70–99)
Glucose-Capillary: 172 mg/dL — ABNORMAL HIGH (ref 70–99)

## 2024-11-05 LAB — T4, FREE: Free T4: 1.29 ng/dL (ref 0.80–2.00)

## 2024-11-05 LAB — AMMONIA: Ammonia: 21 umol/L (ref 9–35)

## 2024-11-05 LAB — VITAMIN B12: Vitamin B-12: 207 pg/mL (ref 180–914)

## 2024-11-05 LAB — TSH: TSH: 4.84 u[IU]/mL — ABNORMAL HIGH (ref 0.350–4.500)

## 2024-11-05 MED ORDER — SODIUM CHLORIDE 0.9 % IV SOLN
1.0000 g | INTRAVENOUS | Status: AC
  Administered 2024-11-05 – 2024-11-07 (×3): 1 g via INTRAVENOUS
  Filled 2024-11-05 (×3): qty 10

## 2024-11-05 MED ORDER — LISINOPRIL 20 MG PO TABS
20.0000 mg | ORAL_TABLET | Freq: Every day | ORAL | Status: DC
  Administered 2024-11-05: 20 mg via ORAL
  Filled 2024-11-05: qty 1

## 2024-11-05 MED ORDER — HYDRALAZINE HCL 20 MG/ML IJ SOLN
20.0000 mg | Freq: Once | INTRAMUSCULAR | Status: AC
  Administered 2024-11-05: 20 mg via INTRAVENOUS
  Filled 2024-11-05: qty 1

## 2024-11-05 MED ORDER — OXYCODONE HCL 5 MG PO TABS: 5.0000 mg | ORAL_TABLET | ORAL | Status: DC | PRN

## 2024-11-05 MED ORDER — LISINOPRIL 20 MG PO TABS
40.0000 mg | ORAL_TABLET | Freq: Every day | ORAL | Status: DC
  Administered 2024-11-05: 40 mg via ORAL
  Filled 2024-11-05: qty 4

## 2024-11-05 MED ORDER — LORAZEPAM 2 MG/ML IJ SOLN
1.0000 mg | Freq: Once | INTRAMUSCULAR | Status: AC
  Administered 2024-11-05: 1 mg via INTRAVENOUS
  Filled 2024-11-05: qty 1

## 2024-11-05 MED ORDER — SENNOSIDES-DOCUSATE SODIUM 8.6-50 MG PO TABS: 1.0000 | ORAL_TABLET | Freq: Every evening | ORAL | Status: DC | PRN

## 2024-11-05 MED ORDER — HYDROMORPHONE HCL 1 MG/ML IJ SOLN: 0.5000 mg | INTRAMUSCULAR | Status: DC | PRN

## 2024-11-05 MED ORDER — SODIUM CHLORIDE 0.9 % IV SOLN: INTRAVENOUS | Status: AC

## 2024-11-05 MED ORDER — ONDANSETRON HCL 4 MG/2ML IJ SOLN: 4.0000 mg | Freq: Four times a day (QID) | INTRAMUSCULAR | Status: DC | PRN

## 2024-11-05 MED ORDER — EZETIMIBE 10 MG PO TABS
10.0000 mg | ORAL_TABLET | Freq: Every day | ORAL | Status: DC
  Administered 2024-11-05 – 2024-11-09 (×5): 10 mg via ORAL
  Filled 2024-11-05 (×5): qty 1

## 2024-11-05 MED ORDER — ONDANSETRON HCL 4 MG PO TABS: 4.0000 mg | ORAL_TABLET | Freq: Four times a day (QID) | ORAL | Status: DC | PRN

## 2024-11-05 MED ORDER — ACETAMINOPHEN 325 MG PO TABS
650.0000 mg | ORAL_TABLET | Freq: Four times a day (QID) | ORAL | Status: DC | PRN
  Administered 2024-11-05 – 2024-11-08 (×2): 650 mg via ORAL
  Filled 2024-11-05 (×2): qty 2

## 2024-11-05 MED ORDER — HYDRALAZINE HCL 20 MG/ML IJ SOLN
10.0000 mg | INTRAMUSCULAR | Status: DC | PRN
  Administered 2024-11-06: 10 mg via INTRAVENOUS
  Filled 2024-11-05: qty 1

## 2024-11-05 MED ORDER — INSULIN ASPART 100 UNIT/ML IJ SOLN
0.0000 [IU] | Freq: Three times a day (TID) | INTRAMUSCULAR | Status: DC
  Administered 2024-11-05 – 2024-11-06 (×3): 2 [IU] via SUBCUTANEOUS
  Administered 2024-11-06: 3 [IU] via SUBCUTANEOUS
  Administered 2024-11-07 (×3): 2 [IU] via SUBCUTANEOUS
  Administered 2024-11-08: 3 [IU] via SUBCUTANEOUS
  Administered 2024-11-08 (×2): 2 [IU] via SUBCUTANEOUS
  Administered 2024-11-09: 3 [IU] via SUBCUTANEOUS
  Filled 2024-11-05 (×3): qty 2
  Filled 2024-11-05: qty 3
  Filled 2024-11-05 (×4): qty 2
  Filled 2024-11-05: qty 3
  Filled 2024-11-05: qty 2

## 2024-11-05 MED ORDER — METOPROLOL SUCCINATE ER 50 MG PO TB24
100.0000 mg | ORAL_TABLET | Freq: Every day | ORAL | Status: DC
  Administered 2024-11-05 – 2024-11-08 (×4): 100 mg via ORAL
  Filled 2024-11-05 (×6): qty 2

## 2024-11-05 MED ORDER — SODIUM CHLORIDE 0.9% FLUSH
3.0000 mL | Freq: Two times a day (BID) | INTRAVENOUS | Status: DC
  Administered 2024-11-05 – 2024-11-09 (×6): 3 mL via INTRAVENOUS

## 2024-11-05 MED ORDER — ATORVASTATIN CALCIUM 10 MG PO TABS
10.0000 mg | ORAL_TABLET | Freq: Every day | ORAL | Status: DC
  Administered 2024-11-05 – 2024-11-08 (×4): 10 mg via ORAL
  Filled 2024-11-05 (×4): qty 1

## 2024-11-05 MED ORDER — MELATONIN 3 MG PO TABS
3.0000 mg | ORAL_TABLET | Freq: Every day | ORAL | Status: DC
  Administered 2024-11-05: 3 mg via ORAL
  Filled 2024-11-05: qty 1

## 2024-11-05 MED ORDER — LEVOTHYROXINE SODIUM 75 MCG PO TABS
75.0000 ug | ORAL_TABLET | Freq: Every day | ORAL | Status: DC
  Administered 2024-11-05 – 2024-11-09 (×5): 75 ug via ORAL
  Filled 2024-11-05 (×2): qty 1
  Filled 2024-11-05 (×3): qty 3

## 2024-11-05 MED ORDER — TRAZODONE HCL 50 MG PO TABS
25.0000 mg | ORAL_TABLET | Freq: Every evening | ORAL | Status: DC | PRN
  Administered 2024-11-05: 25 mg via ORAL
  Filled 2024-11-05: qty 1

## 2024-11-05 MED ORDER — BISACODYL 5 MG PO TBEC
5.0000 mg | DELAYED_RELEASE_TABLET | Freq: Every day | ORAL | Status: DC | PRN
  Administered 2024-11-08: 5 mg via ORAL
  Filled 2024-11-05: qty 1

## 2024-11-05 MED ORDER — FLEET ENEMA RE ENEM: 1.0000 | ENEMA | Freq: Once | RECTAL | Status: DC | PRN

## 2024-11-05 MED ORDER — FUROSEMIDE 20 MG PO TABS
20.0000 mg | ORAL_TABLET | Freq: Every day | ORAL | Status: DC
  Administered 2024-11-05: 20 mg via ORAL
  Filled 2024-11-05: qty 1

## 2024-11-05 MED ORDER — ACETAMINOPHEN 650 MG RE SUPP: 650.0000 mg | Freq: Four times a day (QID) | RECTAL | Status: DC | PRN

## 2024-11-05 MED ORDER — ONDANSETRON HCL 4 MG/2ML IJ SOLN
4.0000 mg | Freq: Once | INTRAMUSCULAR | Status: DC
  Filled 2024-11-05: qty 2

## 2024-11-05 MED ORDER — HEPARIN SODIUM (PORCINE) 5000 UNIT/ML IJ SOLN
5000.0000 [IU] | Freq: Three times a day (TID) | INTRAMUSCULAR | Status: DC
  Administered 2024-11-05 – 2024-11-09 (×12): 5000 [IU] via SUBCUTANEOUS
  Filled 2024-11-05 (×12): qty 1

## 2024-11-05 MED ORDER — AMLODIPINE BESYLATE 10 MG PO TABS
10.0000 mg | ORAL_TABLET | Freq: Every day | ORAL | Status: DC
  Administered 2024-11-05 – 2024-11-09 (×5): 10 mg via ORAL
  Filled 2024-11-05 (×2): qty 1
  Filled 2024-11-05: qty 2
  Filled 2024-11-05 (×2): qty 1

## 2024-11-05 MED ORDER — SODIUM CHLORIDE 0.9% FLUSH
3.0000 mL | Freq: Two times a day (BID) | INTRAVENOUS | Status: DC
  Administered 2024-11-05 – 2024-11-09 (×6): 3 mL via INTRAVENOUS

## 2024-11-05 MED ORDER — ENOXAPARIN SODIUM 30 MG/0.3ML IJ SOSY: 30.0000 mg | PREFILLED_SYRINGE | INTRAMUSCULAR | Status: DC

## 2024-11-05 MED ORDER — IPRATROPIUM BROMIDE 0.02 % IN SOLN: 0.5000 mg | Freq: Four times a day (QID) | RESPIRATORY_TRACT | Status: DC | PRN

## 2024-11-05 NOTE — Plan of Care (Signed)
" °  Problem: Fluid Volume: Goal: Ability to maintain a balanced intake and output will improve Outcome: Progressing   Problem: Nutritional: Goal: Maintenance of adequate nutrition will improve Outcome: Progressing   Problem: Skin Integrity: Goal: Risk for impaired skin integrity will decrease Outcome: Progressing   Problem: Nutrition: Goal: Adequate nutrition will be maintained Outcome: Progressing   Problem: Safety: Goal: Ability to remain free from injury will improve Outcome: Progressing   Problem: Skin Integrity: Goal: Risk for impaired skin integrity will decrease Outcome: Progressing   "

## 2024-11-05 NOTE — Assessment & Plan Note (Signed)
 Blood pressure stable,  - Continue metoprolol , lisinopril , -IV hydralazine 

## 2024-11-05 NOTE — H&P (Addendum)
 " History and Physical   Patient: Stacy Valentine                            PCP: Cleotilde Planas, MD                    DOB: [birth year]            DOA: 11/04/2024 FMW:[id number]             DOS: 11/05/2024, 9:37 AM  Cleotilde Planas, MD  Patient coming from:   HOME  I have personally reviewed patient's medical records, in electronic medical records, including:  [facility] link, and care everywhere.    Chief Complaint:   Chief Complaint  Patient presents with   Altered Mental Status    History of present illness:    Stacy Valentine is a 89 year old female with HTN, ILD, DM2, hypothyroidism, osteoporosis... Presenting with chief complaint of confusion, hallucination,.  Per family it started yesterday afternoon.  Brought into the ED by family member for further evaluation. They reported patient did not have any fever, cough, or was not complaining of any pain.  Able to perform ADLs at baseline  Patient Denies having: Fever, Chills, Cough, SOB, Chest Pain, Abd pain, N/V/D, headache, dizziness, lightheadedness,  Dysuria, Joint pain, rash, open wounds  ED Evaluation:  Blood pressure (!) 179/69, pulse 78, temperature 98.2 F (36.8 C), temperature source Oral, resp. rate 11, weight 63 kg, SpO2 98%. LABs: Hemoglobin 12.1-11.8, creatinine 1.14, GFR 44 UA color, glucose 50, trace of leukocytes, proteinuria, rare bacteria, WBC 6-10 Ct-head: Acute intracranial normalities   To be admitted for encephalopathy of unknown etiology possibly early UTI      Review of Systems: As per HPI, otherwise 10 point review of systems were negative.   ----------------------------------------------------------------------------------------------------------------------  Allergies[1]  Home MEDs:  Prior to Admission medications  Medication Sig Start Date End Date Taking? Authorizing Provider  amLODipine  (NORVASC ) 10 MG tablet Take 10 mg by mouth daily. 01/21/23  Yes [provider]   atorvastatin  (LIPITOR) 10 MG tablet Take 10 mg by mouth daily. 01/21/23  Yes [provider]  Blood Glucose Monitoring Suppl (ONETOUCH VERIO FLEX SYSTEM) w/Device KIT USE AS DIRECTED ONCE DAILY IN THE MORNING TO CHECK FASTING GLUCOSE. GOAL < 130 02/04/23  Yes [provider]  clobetasol ointment (TEMOVATE) 0.05 % SMARTSIG:Topical Twice a Week PRN 03/09/23  Yes [provider]  ezetimibe  (ZETIA ) 10 MG tablet Take 10 mg by mouth daily. 01/21/23  Yes [provider]  furosemide  (LASIX ) 20 MG tablet Take 20 mg by mouth daily. 01/21/23  Yes [provider]  glipiZIDE  (GLUCOTROL  XL) 10 MG 24 hr tablet Take 10 mg by mouth every morning. 01/21/23  Yes [provider]  JANUVIA 50 MG tablet Take 50 mg by mouth daily. 01/21/23  Yes [provider]  Lancets (ONETOUCH DELICA PLUS LANCET33G) MISC Apply topically every morning. 02/04/23  Yes [provider]  levothyroxine  (SYNTHROID ) 75 MCG tablet Take 75 mcg by mouth every morning. 03/03/23  Yes [provider]  lisinopril  (ZESTRIL ) 20 MG tablet Take 20-40 mg by mouth in the morning and at bedtime. 40 MG by mouth in the AM and 20 MG by mouth in the PM 03/03/23  Yes [provider]  metoprolol  succinate (TOPROL -XL) 100 MG 24 hr tablet Take 100 mg by mouth daily. 03/28/23  Yes [provider]  ONETOUCH VERIO test strip  SMARTSIG:Device Via Meter Every Morning 02/04/23  Yes [provider]  alendronate (FOSAMAX) 70 MG tablet Take 70 mg by mouth once a week. Patient not taking: Reported on 11/04/2024 03/28/23   [provider]  oxyCODONE  (ROXICODONE ) 5 MG immediate release tablet Take 1 tablet (5 mg total) by mouth every 4 (four) hours as needed for severe pain (pain score 7-10). Patient not taking: Reported on 11/04/2024 09/13/24   Beverley Doffing A, PA-C    PRN MEDs: acetaminophen  **OR** acetaminophen , bisacodyl , hydrALAZINE , HYDROmorphone  (DILAUDID ) injection,  ipratropium, ondansetron  **OR** ondansetron  (ZOFRAN ) IV, oxyCODONE , senna-docusate, sodium phosphate, traZODone   History reviewed. No pertinent past medical history.  History reviewed. No pertinent surgical history.   reports that she has never smoked. She has never used smokeless tobacco. She reports that she does not currently use alcohol. She reports that she does not currently use drugs.   History reviewed. No pertinent family history.  Physical Exam:   Vitals:   11/05/24 0200 11/05/24 0400 11/05/24 0500 11/05/24 0825  BP: (!) 154/80  (!) 179/69 (!) 163/67  Pulse: 78   80  Resp:   11 14  Temp:   98.2 F (36.8 C) 98 F (36.7 C)  TempSrc:   Oral Oral  SpO2: 99%  98% 97%  Weight:  63 kg     Constitutional: NAD, calm, comfortable -pleasantly confused, Eyes: PERRL, lids and conjunctivae normal ENMT: Mucous membranes are moist. Posterior pharynx clear of any exudate or lesions.Normal dentition.  Neck: normal, supple, no masses, no thyromegaly Respiratory: clear to auscultation bilaterally, no wheezing, no crackles. Normal respiratory effort. No accessory muscle use.  Cardiovascular: Regular rate and rhythm, no murmurs / rubs / gallops. No extremity edema. 2+ pedal pulses. No carotid bruits.  Abdomen: no tenderness, no masses palpated. No hepatosplenomegaly. Bowel sounds positive.  Musculoskeletal: no clubbing / cyanosis. No joint deformity upper and lower extremities. Good ROM, no contractures. Normal muscle tone.  Neurologic: CN II-XII grossly intact. Sensation intact, DTR normal. Strength 5/5 in all 4.  Psychiatric: Mood stable, awake alert and oriented, pleasantly confused Skin: no rashes, lesions, ulcers. No induration      Labs on admission:    I have personally reviewed following labs and imaging studies  CBC: Recent Labs  Lab 11/04/24 2312 11/05/24 0544  WBC 7.1 10.5  NEUTROABS 4.4  --   HGB 12.1 11.8*  HCT 35.5* 34.8*  MCV 92.4 92.3  PLT 227 234   Basic  Metabolic Panel: Recent Labs  Lab 11/04/24 2312 11/05/24 0544  NA 138  --   K 4.0  --   CL 107  --   CO2 20*  --   GLUCOSE 126*  --   BUN 28*  --   CREATININE 1.18* 1.14*  CALCIUM  10.1  --    GFR: Estimated Creatinine Clearance: 22.6 mL/min (A) (by C-G formula based on SCr of 1.14 mg/dL (H)). Liver Function Tests: Recent Labs  Lab 11/04/24 2312  AST 21  ALT <5  ALKPHOS 60  BILITOT 0.9  PROT 6.4*  ALBUMIN 3.5    Urine analysis:    Component Value Date/Time   COLORURINE STRAW (A) 11/04/2024 2343   APPEARANCEUR CLEAR 11/04/2024 2343   LABSPEC 1.007 11/04/2024 2343   PHURINE 6.0 11/04/2024 2343   GLUCOSEU 50 (A) 11/04/2024 2343   HGBUR NEGATIVE 11/04/2024 2343   BILIRUBINUR NEGATIVE 11/04/2024 2343   KETONESUR NEGATIVE 11/04/2024 2343   PROTEINUR >=300 (A) 11/04/2024 2343   NITRITE NEGATIVE 11/04/2024 2343  LEUKOCYTESUR TRACE (A) 11/04/2024 2343    Last A1C:  No results found for: HGBA1C   Radiologic Exams on Admission:   CT HEAD WO CONTRAST ( ) Result Date: 11/05/2024 EXAM: CT HEAD WITHOUT CONTRAST 11/05/2024 12:24:31 AM TECHNIQUE: CT of the head was performed without the administration of intravenous contrast. Automated exposure control, iterative reconstruction, and/or weight based adjustment of the mA/kV was utilized to reduce the radiation dose to as low as reasonably achievable. COMPARISON: 09/17/2024 CLINICAL HISTORY: Mental status change, unknown cause. FINDINGS: BRAIN AND VENTRICLES: Global cortical atrophy. Subcortical and periventricular small vessel ischemic changes. Intracranial atherosclerosis. No acute hemorrhage. No evidence of acute infarct. No hydrocephalus. No extra-axial collection. No mass effect or midline shift. ORBITS: No acute abnormality. SINUSES: No acute abnormality. SOFT TISSUES AND SKULL: No acute soft tissue abnormality. No skull fracture. IMPRESSION: 1. No acute intracranial abnormality. Electronically signed by: Pinkie Pebbles MD  MD 11/05/2024 12:28 AM EST RP Workstation: [id number]   DG Chest Port 1 View Result Date: 11/04/2024 EXAM: 1 VIEW(S) XRAY OF THE CHEST 11/04/2024 11:06:00 PM COMPARISON: None available. CLINICAL HISTORY: confusion, ?aspiration last night FINDINGS: LUNGS AND PLEURA: No focal pulmonary opacity. No pleural effusion. No pneumothorax. HEART AND MEDIASTINUM: Aortic arch calcifications. No acute abnormality of the cardiac and mediastinal silhouettes. BONES AND SOFT TISSUES: Surgical clips in left axilla. Degenerative changes of bilateral AC joints. Multilevel degenerative changes of thoracic spine. IMPRESSION: 1. No acute cardiopulmonary process. 2. Surgical clips in the left axilla. 3. Aortic arch atherosclerotic calcifications. Electronically signed by: Dorethia Molt MD MD 11/04/2024 11:16 PM EST RP Workstation: [id number]    EKG:   Independently reviewed.  Orders placed or performed during the hospital encounter of 11/04/24   EKG 12-Lead   ---------------------------------------------------------------------------------------------------------------------------------------    Assessment / Plan:   Principal Problem:   Encephalopathy acute Active Problems:   Sepsis secondary to UTI (HCC)   Diabetes mellitus without complication (HCC)   Essential hypertension   Hyperlipidemia   Hypothyroidism   Assessment and Plan: * Encephalopathy acute Metabolic encephalopathy of unknown etiology -Pleasantly confused  (?  Underlying dementia) -Possible UTI /mild dehydration -CT head reviewed, within normal limits -Continue neurocheck -Continue hydration -Empiric antibiotics -Follow-up with cultures  -Will obtain further labs including ESR, CRP, B12, TSH, etc.) If worsening cephalopathy may need further imaging  Sepsis secondary to UTI (HCC) Altered mental status with a possible UTI -Sepsis ruled out  UA: Straw-colored 50 glucose, trace of leukocyte esterase, rare bacteria, WBC of 6-10 -UA  not very impressive -Will follow-up with cultures -Initiating empiric IV antibiotics for 3 days-may be discontinued earlier if cultures negative  Essential hypertension Blood pressure stable,  - Continue metoprolol , lisinopril , -IV hydralazine   Diabetes mellitus without complication (HCC) Last A1c 6.6 -Checking CBG q. ACHS, with SSI coverage -Medication on review-is not been finalized - Home medication may include glipizide , Januvia     Hyperlipidemia Continue atorvastatin   Hypothyroidism Continue home dose Synthroid  -Checking TSH, free T3 and 4      Consults called:  None -------------------------------------------------------------------------------------------------------------------------------------------- DVT prophylaxis:  heparin  injection 5,000 Units Start: 11/05/24 0730 SCDs Start: 11/05/24 0715 SCDs Start: 11/05/24 0408   Code Status:   Code Status: Full Code   Admission status: Patient will be admitted as Observation, with a greater than 2 midnight length of stay. Level of care: Med-Surg   Family Communication: Spoke to her daughter Caitlen Worth -updated (The above findings and plan of care has been discussed with patient in detail, the patient expressed understanding and  agreement of above plan)  --------------------------------------------------------------------------------------------------------------------------------------------------  Disposition Plan:  Anticipated 1-2 days Status is: Observation The patient remains OBS appropriate and will d/c before 2 midnights.     ----------------------------------------------------------------------------------------------------------------------------------------------------  Time spent:  65  Min.  Was spent seeing and evaluating the patient, reviewing all medical records, drawn plan of care.  SIGNED: Adriana DELENA Grams, MD, FHM. FAAFP. [facility], Pager  (Please use [email or web address]  to page/ or secure chat through epic) If 7PM-7AM, please contact night-coverage [email or web address],  11/05/2024, 9:37 AM     [1]  Allergies Allergen Reactions   Penicillins    "

## 2024-11-05 NOTE — Assessment & Plan Note (Addendum)
 Altered mental status with a possible UTI -Sepsis ruled out  UA: Straw-colored 50 glucose, trace of leukocyte esterase, rare bacteria, WBC of 6-10 -UA not very impressive -Will follow-up with cultures -Initiating empiric IV antibiotics for 3 days-may be discontinued earlier if cultures negative

## 2024-11-05 NOTE — Assessment & Plan Note (Addendum)
 Metabolic encephalopathy of unknown etiology -Pleasantly confused  (?  Underlying dementia) -Possible UTI /mild dehydration -CT head reviewed, within normal limits -Continue neurocheck -Continue hydration -Empiric antibiotics -Follow-up with cultures  -Will obtain further labs including ESR, CRP, B12, TSH, etc.) If worsening cephalopathy may need further imaging

## 2024-11-05 NOTE — Assessment & Plan Note (Signed)
 Continue home dose Synthroid  -Checking TSH, free T3 and 4
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
## 2024-11-05 NOTE — Assessment & Plan Note (Signed)
 Continue atorvastatin 

## 2024-11-05 NOTE — Assessment & Plan Note (Addendum)
 Last A1c 6.6 -Checking CBG q. ACHS, with SSI coverage -Medication on review-is not been finalized - Home medication may include glipizide , Januvia

## 2024-11-05 NOTE — Hospital Course (Addendum)
 Stacy Valentine is a 89 year old female with HTN, ILD, DM2, hypothyroidism, osteoporosis... Presenting with chief complaint of confusion, hallucination,.  Per family it started yesterday afternoon.  Brought into the ED by family member for further evaluation. They reported patient did not have any fever, cough, or was not complaining of any pain.  Able to perform ADLs at baseline    ED Evaluation:  Blood pressure (!) 179/69, pulse 78, temperature 98.2 F (36.8 C), temperature source Oral, resp. rate 11, weight 63 kg, SpO2 98%. LABs: Hemoglobin 12.1-11.8, creatinine 1.14, GFR 44 UA color, glucose 50, trace of leukocytes, proteinuria, rare bacteria, WBC 6-10 Ct-head: Acute intracranial normalities   To be admitted for encephalopathy of unknown etiology possibly early UTI

## 2024-11-06 ENCOUNTER — Ambulatory Visit: Admitting: Podiatry

## 2024-11-06 DIAGNOSIS — N185 Chronic kidney disease, stage 5: Secondary | ICD-10-CM | POA: Diagnosis present

## 2024-11-06 DIAGNOSIS — E860 Dehydration: Secondary | ICD-10-CM | POA: Diagnosis present

## 2024-11-06 DIAGNOSIS — E785 Hyperlipidemia, unspecified: Secondary | ICD-10-CM | POA: Diagnosis present

## 2024-11-06 DIAGNOSIS — G9340 Encephalopathy, unspecified: Secondary | ICD-10-CM

## 2024-11-06 DIAGNOSIS — G9341 Metabolic encephalopathy: Secondary | ICD-10-CM | POA: Diagnosis present

## 2024-11-06 DIAGNOSIS — R1310 Dysphagia, unspecified: Secondary | ICD-10-CM | POA: Diagnosis present

## 2024-11-06 DIAGNOSIS — E039 Hypothyroidism, unspecified: Secondary | ICD-10-CM | POA: Diagnosis present

## 2024-11-06 DIAGNOSIS — M810 Age-related osteoporosis without current pathological fracture: Secondary | ICD-10-CM | POA: Diagnosis present

## 2024-11-06 DIAGNOSIS — Z79890 Hormone replacement therapy (postmenopausal): Secondary | ICD-10-CM | POA: Diagnosis not present

## 2024-11-06 DIAGNOSIS — E1122 Type 2 diabetes mellitus with diabetic chronic kidney disease: Secondary | ICD-10-CM | POA: Diagnosis present

## 2024-11-06 DIAGNOSIS — F0392 Unspecified dementia, unspecified severity, with psychotic disturbance: Secondary | ICD-10-CM | POA: Diagnosis present

## 2024-11-06 DIAGNOSIS — N390 Urinary tract infection, site not specified: Secondary | ICD-10-CM | POA: Diagnosis present

## 2024-11-06 DIAGNOSIS — J849 Interstitial pulmonary disease, unspecified: Secondary | ICD-10-CM | POA: Diagnosis present

## 2024-11-06 DIAGNOSIS — Z7983 Long term (current) use of bisphosphonates: Secondary | ICD-10-CM | POA: Diagnosis not present

## 2024-11-06 DIAGNOSIS — Z7409 Other reduced mobility: Secondary | ICD-10-CM | POA: Diagnosis present

## 2024-11-06 DIAGNOSIS — I120 Hypertensive chronic kidney disease with stage 5 chronic kidney disease or end stage renal disease: Secondary | ICD-10-CM | POA: Diagnosis present

## 2024-11-06 DIAGNOSIS — R4182 Altered mental status, unspecified: Secondary | ICD-10-CM | POA: Diagnosis present

## 2024-11-06 DIAGNOSIS — Z79899 Other long term (current) drug therapy: Secondary | ICD-10-CM | POA: Diagnosis not present

## 2024-11-06 DIAGNOSIS — E1165 Type 2 diabetes mellitus with hyperglycemia: Secondary | ICD-10-CM | POA: Diagnosis present

## 2024-11-06 DIAGNOSIS — Z7984 Long term (current) use of oral hypoglycemic drugs: Secondary | ICD-10-CM | POA: Diagnosis not present

## 2024-11-06 LAB — BASIC METABOLIC PANEL WITH GFR
Anion gap: 15 (ref 5–15)
BUN: 27 mg/dL — ABNORMAL HIGH (ref 8–23)
CO2: 18 mmol/L — ABNORMAL LOW (ref 22–32)
Calcium: 10 mg/dL (ref 8.9–10.3)
Chloride: 108 mmol/L (ref 98–111)
Creatinine, Ser: 1.31 mg/dL — ABNORMAL HIGH (ref 0.44–1.00)
GFR, Estimated: 37 mL/min — ABNORMAL LOW
Glucose, Bld: 270 mg/dL — ABNORMAL HIGH (ref 70–99)
Potassium: 4.1 mmol/L (ref 3.5–5.1)
Sodium: 141 mmol/L (ref 135–145)

## 2024-11-06 LAB — CBC
HCT: 32.9 % — ABNORMAL LOW (ref 36.0–46.0)
Hemoglobin: 11.5 g/dL — ABNORMAL LOW (ref 12.0–15.0)
MCH: 31.7 pg (ref 26.0–34.0)
MCHC: 35 g/dL (ref 30.0–36.0)
MCV: 90.6 fL (ref 80.0–100.0)
Platelets: 222 K/uL (ref 150–400)
RBC: 3.63 MIL/uL — ABNORMAL LOW (ref 3.87–5.11)
RDW: 13.2 % (ref 11.5–15.5)
WBC: 8.8 K/uL (ref 4.0–10.5)
nRBC: 0 % (ref 0.0–0.2)

## 2024-11-06 LAB — URINE CULTURE: Culture: 20000 — AB

## 2024-11-06 LAB — GLUCOSE, CAPILLARY
Glucose-Capillary: 225 mg/dL — ABNORMAL HIGH (ref 70–99)
Glucose-Capillary: 163 mg/dL — ABNORMAL HIGH (ref 70–99)
Glucose-Capillary: 101 mg/dL — ABNORMAL HIGH (ref 70–99)
Glucose-Capillary: 198 mg/dL — ABNORMAL HIGH (ref 70–99)

## 2024-11-06 MED ORDER — TRAZODONE HCL 50 MG PO TABS
50.0000 mg | ORAL_TABLET | Freq: Every day | ORAL | Status: DC
  Administered 2024-11-06 – 2024-11-07 (×2): 50 mg via ORAL
  Filled 2024-11-06 (×2): qty 1

## 2024-11-06 MED ORDER — MELATONIN 5 MG PO TABS
5.0000 mg | ORAL_TABLET | Freq: Every day | ORAL | Status: DC
  Administered 2024-11-06 – 2024-11-07 (×2): 5 mg via ORAL
  Filled 2024-11-06 (×2): qty 1

## 2024-11-06 MED ORDER — VITAMIN B-12 1000 MCG PO TABS
1000.0000 ug | ORAL_TABLET | Freq: Every day | ORAL | Status: DC
  Administered 2024-11-06 – 2024-11-09 (×4): 1000 ug via ORAL
  Filled 2024-11-06 (×4): qty 1

## 2024-11-06 MED ORDER — SODIUM CHLORIDE 0.9 % IV SOLN
INTRAVENOUS | Status: AC
  Administered 2024-11-06: 1000 mL via INTRAVENOUS

## 2024-11-06 NOTE — TOC Initial Note (Signed)
 Transition of Care [facility]) - Initial/Assessment Note    Patient Details  Name: Stacy Valentine MRN: [id number] Date of Birth: 12-29-1925  [facility] CM/SW Contact:    Toy LITTIE Agar, RN Phone Number:[phone]  11/06/2024, 3:54 PM  Clinical Narrative:                 Inpatient care manager following patient admitted from home with recommendations for SNF. CM at bedside patient is pleasantly confuse.CM spoke with daughter Inocente. Patient has SNF recommendation and CM has initiated SNF workup. Will follow up for medical appropriateness to fax out for bed offers.   Expected Discharge Plan: Skilled Nursing Facility Barriers to Discharge: Continued Medical Work up   Patient Goals and CMS Choice Patient states their goals for this hospitalization and ongoing recovery are:: Patient is confused unable to answer CMS Medicare.gov Compare Post Acute Care list provided to:: Patient Represenative (must comment) (daughter Inocente) Choice offered to / list presented to : Adult Children (discussed with daughter, will follow up once bed offers recieved)      Expected Discharge Plan and Services In-house Referral: NA Discharge Planning Services: CM Consult Post Acute Care Choice: Skilled Nursing Facility Living arrangements for the past 2 months: Single Family Home                 DME Arranged: N/A DME Agency: NA       HH Arranged: NA HH Agency: NA        Prior Living Arrangements/Services Living arrangements for the past 2 months: Single Family Home Lives with:: Adult Children   Do you feel safe going back to the place where you live?: Yes      Need for Family Participation in Patient Care: Yes (Comment) Care giver support system in place?: Yes (comment)   Criminal Activity/Legal Involvement Pertinent to Current Situation/Hospitalization: No - Comment as needed  Activities of Daily Living   ADL Screening (condition at time of admission) Independently performs ADLs?:  Yes (appropriate for developmental age) Is the patient deaf or have difficulty hearing?: Yes Does the patient have difficulty seeing, even when wearing glasses/contacts?: Yes Does the patient have difficulty concentrating, remembering, or making decisions?: Yes  Permission Sought/Granted Permission sought to share information with : Family Supports Permission granted to share information with : No (confused daughter is listed)  Share Information with NAME: Inocente Crawford  Permission granted to share info w AGENCY: n/a  Permission granted to share info w Relationship: daughter  Permission granted to share info w Contact Information: [phone]  Emotional Assessment Appearance:: Appears stated age Attitude/Demeanor/Rapport: Gracious (plesantly confused) Affect (typically observed): Unable to Assess   Alcohol / Substance Use: Not Applicable Psych Involvement: No (comment)  Admission diagnosis:  Encephalopathy acute [G93.40] Altered mental status, unspecified altered mental status type [R41.82] Patient Active Problem List   Diagnosis Date Noted   Encephalopathy acute 11/05/2024   Essential hypertension 11/05/2024   Hyperlipidemia 11/05/2024   Hypothyroidism 11/05/2024   Sepsis secondary to UTI (HCC) 11/05/2024   Diabetes mellitus without complication (HCC) 03/29/2023   PCP:  Cleotilde Planas, MD Pharmacy:   [facility] [address] [place] - [address] [place] [address] Phone: [phone] Fax: [phone]     Social Drivers of Health (SDOH) Social History: SDOH Screenings   Food Insecurity: No Food Insecurity (11/05/2024)  Housing: Low Risk (11/05/2024)  Transportation Needs: No Transportation Needs (11/05/2024)  Utilities: Not At Risk (11/05/2024)  Social Connections: Unknown (11/05/2024)  Tobacco Use: Low Risk (11/05/2024)   SDOH Interventions:     Readmission Risk Interventions     No data to display

## 2024-11-06 NOTE — TOC Progression Note (Signed)
 Transition of Care [facility]) - Progression Note    Patient Details  Name: Stacy Valentine MRN: [id number] Date of Birth: [birth year]  Transition of Care [facility]) CM/SW Contact  Toy LITTIE Agar, RN Phone Number:[phone]  11/06/2024, 10:32 AM  Clinical Narrative:    Inpatient care manager acknowledges generic consult for Home health/DME needs. Following for therapy recommendations. Currently there are no therapy notes.                      Expected Discharge Plan and Services                                               Social Drivers of Health (SDOH) Interventions SDOH Screenings   Food Insecurity: No Food Insecurity (11/05/2024)  Housing: Low Risk (11/05/2024)  Transportation Needs: No Transportation Needs (11/05/2024)  Utilities: Not At Risk (11/05/2024)  Social Connections: Unknown (11/05/2024)  Tobacco Use: Low Risk (11/05/2024)    Readmission Risk Interventions     No data to display

## 2024-11-06 NOTE — Progress Notes (Signed)
 Stacy Valentine is using a purewick. The tech did not document the output, the canister looked to be around 300 ml output, yellow urine

## 2024-11-06 NOTE — Evaluation (Signed)
 Occupational Therapy Evaluation Patient Details Name: Stacy Valentine MRN: [id number] DOB: 1926/02/29 Today's Date: 11/06/2024   History of Present Illness   Stacy Valentine is a 89 year old female presenting with chief complaint of confusion, hallucination on 11/04/24.   Pt dx with acute encephalopathy and sepsis secondary to UTI.  MRI of head was negative for acute infarct. PMH: HTN, ILD, DM2, hypothyroidism, osteoporosis.     Clinical Impressions PTA, patient lives at home with family and was walking and managing steps for bathing and BADL's using RW. Currently, patient presents with deficits outlined below (see OT Problem List for details) most significantly decreased cognition, vision, balance, activity tolerance, generalized muscle strength, motor planning and coordination limiting BADL's (max-total A LB) and functional mobility (+2 mod A STS) performance. Patient will benefit from continued inpatient follow up therapy, <3 hours/day. Patient requires continued Acute care hospital level OT services to progress safety and functional performance and allow for discharge.       If plan is discharge home, recommend the following:   Two people to help with walking and/or transfers;A lot of help with bathing/dressing/bathroom;Assistance with cooking/housework;Assistance with feeding;Direct supervision/assist for medications management;Direct supervision/assist for financial management;Assist for transportation;Help with stairs or ramp for entrance;Supervision due to cognitive status     Functional Status Assessment   Patient has had a recent decline in their functional status and demonstrates the ability to make significant improvements in function in a reasonable and predictable amount of time.     Equipment Recommendations   None recommended by OT      Precautions/Restrictions   Precautions Precautions: Fall Restrictions Weight Bearing Restrictions Per Provider Order:  No     Mobility Bed Mobility Overal bed mobility: Needs Assistance Bed Mobility: Supine to Sit, Sit to Supine     Supine to sit: Mod assist, +2 for physical assistance Sit to supine: Mod assist, +2 for physical assistance   General bed mobility comments: posterior bias, difficulty scooting forward    Transfers Overall transfer level: Needs assistance Equipment used: Rolling walker (2 wheels) Transfers: Sit to/from Stand Sit to Stand: Mod assist, +2 physical assistance           General transfer comment: Once able to get pt leaning forward she stood with light mod A x 2 and increased tiem; stood x 2; unable to coordinate/motor plan for transfer to chair (additionally, pt has been confused and no sleep so returned to bed)      Balance Overall balance assessment: Needs assistance Sitting-balance support: Bilateral upper extremity supported Sitting balance-Leahy Scale: Poor Sitting balance - Comments: Requiring mod/max A initiallly with posterior and right lean.  Some improvements with multimodal cues to lean forward and progressed to CGA to min A   Standing balance support: Bilateral upper extremity supported Standing balance-Leahy Scale: Poor Standing balance comment: UE support and min-mod A of 2 for safety                           ADL either performed or assessed with clinical judgement   ADL Overall ADL's : Needs assistance/impaired Eating/Feeding: Moderate assistance;Sitting;Cueing for sequencing;Cueing for safety Eating/Feeding Details (indicate cue type and reason): cup to mouth Grooming: Wash/dry hands;Wash/dry face;Maximal assistance;Sitting;Cueing for sequencing   Upper Body Bathing: Total assistance;Bed level   Lower Body Bathing: Total assistance;Bed level   Upper Body Dressing : Total assistance;Bed level   Lower Body Dressing: Total assistance;Bed level       Toileting-  Clothing Manipulation and Hygiene: Total assistance;Bed  level Toileting - Clothing Manipulation Details (indicate cue type and reason): using catheter currently for voiding     Functional mobility during ADLs: +2 for physical assistance;+2 for safety/equipment;Rolling walker (2 wheels);Moderate assistance General ADL Comments: poor coorination, tolerance to prolonged tasks     Vision Ability to See in Adequate Light: 1 Impaired Patient Visual Report: Other (comment) (hx of Charles Bonnet syndrome)       Perception Perception: Impaired Preception Impairment Details: Figure ground     Praxis Praxis: Impaired Praxis Impairment Details: Motor planning, Organization     Pertinent Vitals/Pain Pain Assessment Pain Assessment: No/denies pain     Extremity/Trunk Assessment Upper Extremity Assessment Upper Extremity Assessment: Right hand dominant;Generalized weakness;RUE deficits/detail;LUE deficits/detail RUE Deficits / Details: tremulous with + dysmetria, AROM 0-120 sh flexion RUE Coordination: decreased fine motor;decreased gross motor LUE Deficits / Details: tremulous with + dysmetria, AROM 0-120 sh flexion LUE Coordination: decreased fine motor;decreased gross motor   Lower Extremity Assessment Lower Extremity Assessment: Defer to PT evaluation   Cervical / Trunk Assessment Cervical / Trunk Assessment: Kyphotic   Communication Communication Communication: Impaired Factors Affecting Communication: Difficulty expressing self;Reduced clarity of speech   Cognition Arousal: Alert Behavior During Therapy: Impulsive, Restless Cognition: History of cognitive impairments             OT - Cognition Comments: obtunded vs alert fluctuation, Ox 2-3 cues for exact date, decreased STM, sequencing, safety and judgement                 Following commands: Impaired Following commands impaired: Follows one step commands inconsistently     Cueing  General Comments   Cueing Techniques: Verbal cues;Gestural cues;Tactile  cues;Visual cues  taken off tele as per tele desk, nursing aware and will put back on, delicate skin integrity, no SOB this session           Home Living Family/patient expects to be discharged to:: Private residence Living Arrangements: Children Available Help at Discharge: Family;Available 24 hours/day Type of Home: House Home Access: Ramped entrance     Home Layout: Multi-level;1/2 bath on main level Alternate Level Stairs-Number of Steps: Pt's bedroom upstairs - flight with rails and prefers to be upstairs.  Could stay downstairs (has daybed and half bath)   Bathroom Shower/Tub: Producer, Television/film/video: Standard     Home Equipment: Grab bars - tub/shower;Shower seat;Wheelchair - manual   Additional Comments: Has rollator vs RW (granddaughter not sure which, thinks rollator)      Prior Functioning/Environment Prior Level of Function : Needs assist             Mobility Comments: Ambulatory in home with rollator vs RW; 1 fall in November; goes upstairs with assist daily; has bil knee OA -gets injections and just fitted for braces ADLs Comments: At least supervision with adls; full assist iadls    OT Problem List: Decreased strength;Decreased activity tolerance;Impaired balance (sitting and/or standing);Impaired vision/perception;Decreased coordination;Decreased cognition;Decreased safety awareness;Decreased knowledge of use of DME or AE;Decreased knowledge of precautions;Cardiopulmonary status limiting activity;Impaired UE functional use   OT Treatment/Interventions: Self-care/ADL training;Therapeutic exercise;Neuromuscular education;Energy conservation;DME and/or AE instruction;Therapeutic activities;Cognitive remediation/compensation;Visual/perceptual remediation/compensation;Patient/family education;Balance training      OT Goals(Current goals can be found in the care plan section)   Acute Rehab OT Goals Patient Stated Goal: to have her get stronger-  gdt OT Goal Formulation: With family Time For Goal Achievement: 11/20/24 Potential to Achieve Goals: Fair ADL Goals Pt  Will Perform Eating: with supervision;sitting Pt Will Perform Grooming: with supervision;sitting Pt Will Perform Upper Body Bathing: with supervision;sitting Pt Will Perform Upper Body Dressing: with contact guard assist;sitting Pt Will Transfer to Toilet: with +2 assist;with min assist;bedside commode   OT Frequency:  Min 2X/week       AM-PAC OT 6 Clicks Daily Activity     Outcome Measure Help from another person eating meals?: A Lot Help from another person taking care of personal grooming?: A Lot Help from another person toileting, which includes using toliet, bedpan, or urinal?: Total Help from another person bathing (including washing, rinsing, drying)?: Total Help from another person to put on and taking off regular upper body clothing?: Total Help from another person to put on and taking off regular lower body clothing?: Total 6 Click Score: 8   End of Session Equipment Utilized During Treatment: Rolling walker (2 wheels);Gait belt Nurse Communication: Mobility status  Activity Tolerance: Patient limited by fatigue Patient left: in bed;with call bell/phone within reach;with bed alarm set;with family/visitor present  OT Visit Diagnosis: Unsteadiness on feet (R26.81);Muscle weakness (generalized) (M62.81);Feeding difficulties (R63.3);Cognitive communication deficit (R41.841)                Time: 1010-1040 OT Time Calculation (min): 30 min Charges:  OT General Charges $OT Visit: 1 Visit OT Evaluation $OT Eval Low Complexity: 1 Low  Tanzie Rothschild OT/L Acute Rehabilitation Department  [phone]  11/06/2024, 12:20 PM

## 2024-11-06 NOTE — Progress Notes (Signed)
" °   11/06/24 1034  TOC Brief Assessment  Insurance and Status Reviewed  Patient has primary care physician Yes  Home environment has been reviewed From home with daughter  Prior level of function: Independent  Prior/Current Home Services No current home services  Social Drivers of Health Review SDOH reviewed no interventions necessary  Readmission risk has been reviewed Yes  Transition of care needs no transition of care needs at this time    "

## 2024-11-06 NOTE — Care Management Obs Status (Signed)
 MEDICARE OBSERVATION STATUS NOTIFICATION   Patient Details  Name: Stacy Valentine MRN: [id number] Date of Birth: 03/14/1926   Medicare Observation Status Notification Given:  Yes    Toy LITTIE Agar, RN 11/06/2024, 10:12 AM

## 2024-11-06 NOTE — Evaluation (Signed)
 Physical Therapy Evaluation Patient Details Name: Stacy Valentine MRN: [id number] DOB: [birth year] Today's Date: 11/06/2024  History of Present Illness  Stacy Valentine is a 89 year old female presenting with chief complaint of confusion, hallucination on 11/04/24.   Pt dx with acute encephalopathy and sepsis secondary to UTI.  MRI of head was negative for acute infarct. PMH: HTN, ILD, DM2, hypothyroidism, osteoporosis.  Clinical Impression  Pt admitted with above diagnosis. At baseline, pt ambulatory with RW and goes up a flight of stairs daily.  She has DME and home support.  Today, pt confused but cooperative.  She does need increased time and cues for transfers.  Required mod A of 2 for bed mobility and to stand.  Tends to have posterior and R lateral lean.  Pt difficulty coordinating movements for further transfers and fatigued.  She had no sleep last night and appeared to be falling asleep when returned to bed.  Pt well below baseline. Pt currently with functional limitations due to the deficits listed below (see PT Problem List). Pt will benefit from acute skilled PT to increase their independence and safety with mobility to allow discharge.  Patient will benefit from continued inpatient follow up therapy, <3 hours/day at this time.          If plan is discharge home, recommend the following: Two people to help with walking and/or transfers;Two people to help with bathing/dressing/bathroom   Can travel by private vehicle   No    Equipment Recommendations None recommended by PT  Recommendations for Other Services       Functional Status Assessment Patient has had a recent decline in their functional status and demonstrates the ability to make significant improvements in function in a reasonable and predictable amount of time.     Precautions / Restrictions Precautions Precautions: Fall      Mobility  Bed Mobility Overal bed mobility: Needs Assistance Bed Mobility: Supine to  Sit, Sit to Supine     Supine to sit: Mod assist, +2 for physical assistance Sit to supine: Mod assist, +2 for physical assistance   General bed mobility comments: posterior bias, difficulty scooting forward    Transfers Overall transfer level: Needs assistance Equipment used: Rolling walker (2 wheels) Transfers: Sit to/from Stand Sit to Stand: Mod assist, +2 physical assistance           General transfer comment: Once able to get pt leaning forward she stood with light mod A x 2 and increased tiem; stood x 2; unable to coordinate/motor plan for transfer to chair (additionally, pt has been confused and no sleep so returned to bed)    Ambulation/Gait                  Stairs            Wheelchair Mobility     Tilt Bed    Modified Rankin (Stroke Patients Only)       Balance Overall balance assessment: Needs assistance Sitting-balance support: Bilateral upper extremity supported Sitting balance-Leahy Scale: Poor Sitting balance - Comments: Requiring mod/max A initiallly with posterior and right lean.  Some improvements with multimodal cues to lean forward and progressed to CGA to min A   Standing balance support: Bilateral upper extremity supported Standing balance-Leahy Scale: Poor Standing balance comment: UE support and min-mod A of 2 for safety  Pertinent Vitals/Pain Pain Assessment Pain Assessment: No/denies pain    Home Living Family/patient expects to be discharged to:: Private residence Living Arrangements: Children Available Help at Discharge: Family;Available 24 hours/day Type of Home: House Home Access: Ramped entrance     Alternate Level Stairs-Number of Steps: Pt's bedroom upstairs - flight with rails and prefers to be upstairs.  Could stay downstairs (has daybed and half bath) Home Layout: Multi-level;1/2 bath on main level Home Equipment: Grab bars - tub/shower;Shower seat;Wheelchair -  manual Additional Comments: Has rollator vs RW (granddaughter not sure which, thinks rollator)    Prior Function Prior Level of Function : Needs assist             Mobility Comments: Ambulatory in home with rollator vs RW; 1 fall in November; goes upstairs with assist daily; has bil knee OA -gets injections and just fitted for braces ADLs Comments: At least supervision with adls; full assist iadls     Extremity/Trunk Assessment   Upper Extremity Assessment Upper Extremity Assessment: Defer to OT evaluation    Lower Extremity Assessment Lower Extremity Assessment: Difficult to assess due to impaired cognition;Generalized weakness (Hx of bil OA; severe genu varus on right)    Cervical / Trunk Assessment Cervical / Trunk Assessment: Kyphotic  Communication   Communication Factors Affecting Communication: Difficulty expressing self;Reduced clarity of speech    Cognition Arousal: Alert Behavior During Therapy: Impulsive, Restless   PT - Cognitive impairments: Orientation, Awareness, Initiation, Sequencing, Problem solving, Safety/Judgement, Memory                       PT - Cognition Comments: Noted pt had been combative last night.  She was restless and impulsive but cooperative with therapy with cues and increased time. Following commands: Impaired Following commands impaired: Follows one step commands inconsistently     Cueing Cueing Techniques: Verbal cues, Gestural cues, Tactile cues, Visual cues     General Comments General comments (skin integrity, edema, etc.): Called telemetry prior and reports pt not on telemetry so removed telemetry as pt confused and pulling at lines.  Took to diplomatic services operational officer after session.  Spoke with nurse who reports pt is on telemetry and reapplied.    Exercises     Assessment/Plan    PT Assessment Patient needs continued PT services  PT Problem List Decreased strength;Decreased range of motion;Decreased activity tolerance;Decreased  mobility;Decreased cognition;Decreased knowledge of use of DME;Decreased knowledge of precautions;Decreased safety awareness;Decreased coordination       PT Treatment Interventions DME instruction;Therapeutic exercise;Gait training;Functional mobility training;Therapeutic activities;Patient/family education;Cognitive remediation;Neuromuscular re-education;Balance training;Stair training    PT Goals (Current goals can be found in the Care Plan section)  Acute Rehab PT Goals Patient Stated Goal: granddaughter acknowledged may need rehab but hopeful to progress if able PT Goal Formulation: With patient/family Time For Goal Achievement: 11/20/24 Potential to Achieve Goals: Good    Frequency Min 2X/week     Co-evaluation               AM-PAC PT 6 Clicks Mobility  Outcome Measure Help needed turning from your back to your side while in a flat bed without using bedrails?: Total Help needed moving from lying on your back to sitting on the side of a flat bed without using bedrails?: Total Help needed moving to and from a bed to a chair (including a wheelchair)?: Total Help needed standing up from a chair using your arms (e.g., wheelchair or bedside chair)?: Total Help needed to walk  in hospital room?: Total Help needed climbing 3-5 steps with a railing? : Total 6 Click Score: 6    End of Session Equipment Utilized During Treatment: Gait belt Activity Tolerance: Patient tolerated treatment well Patient left: in bed;with call bell/phone within reach;with bed alarm set;with family/visitor present Nurse Communication: Mobility status PT Visit Diagnosis: Other abnormalities of gait and mobility (R26.89);Muscle weakness (generalized) (M62.81)    Time: 8986-8958 PT Time Calculation (min) (ACUTE ONLY): 28 min   Charges:   PT Evaluation $PT Eval Moderate Complexity: 1 Mod   PT General Charges $$ ACUTE PT VISIT: 1 Visit         Benjiman, PT Acute Rehab Services [facility]  [phone]   Benjiman VEAR Mulberry 11/06/2024, 11:00 AM

## 2024-11-06 NOTE — Progress Notes (Signed)
 Patient remains very confused and alert only to self. It is very difficult to change patient and obtain vitals. She becomes extremely agitated and combative (hitting staff)  Vitals were difficult to obtain and daughter said it was ok to hold off on vitals in the middle of the night.  Despite giving patient trazadone and melatonin (given separate), Patient never slept. Patient moves arms and legs well, but unable to cooperate or follow commands cognitively.Will continue to monitor.Kadedra Vanaken M

## 2024-11-06 NOTE — Progress Notes (Signed)
 " PROGRESS NOTE  Stacy Valentine  DOB: 12/25/1925  PCP: Cleotilde Planas, MD FMW:[id number]  DOA: 11/04/2024  LOS: 0 days  Hospital Day: 3  Subjective: Patient was seen and examined this morning. Elderly Caucasian female.  Lying down in bed.  Looks dehydrated.  Alert, awake, mumbling.  Confused. In the last 24 hours, patient has had episodes of disorientation, agitation, has refused care, tried to hit staff. It seems, patient did not sleep at all last night despite trazodone , melatonin. Granddaughter at bedside.  She stated that she took a nap earlier in the morning and later participated with PT.  Mental status has been fluctuating. Chart reviewed.   Afebrile, heart rate in 70s and 80s, blood pressure overnight was wildly fluctuating between low 100s to 180s systolic. Breathing on room air.   Brief narrative: Stacy Valentine is a 89 y.o. female with PMH significant for DM2, HTN, HLD, hypothyroidism, osteoporosis. Patient lives at home.  At baseline able to take care of daily activities and did not have any symptoms or signs of dementia. 1/10, patient was brought to the ED by EMS for rapidly progressive confusion, disorientation, hallucination aggressive behavior. Per family, symptoms started around 24 hours prior to presentation. No fever, cough, dysuria, any other localizing symptoms of infection.  In the ED, patient was afebrile, heart rate 60s and 70s, initial blood pressure was elevated to 179/69, breathing on room air. Initial labs with WC count normal at 7.1, BUN/creatinine 28/1.18, lactic acid 1 Respiratory virus panel unremarkable. Urinalysis with clear straw-colored urine with trace leukocytes, rare bacteria Urine culture sent Chest x-ray unremarkable CT head unremarkable MRI brain did not show any evidence of acute intracranial abnormality, showed mild chronic small vessel ischemic disease.  Admitted to [facility] for further evaluation of encephalopathy.  Assessment and  plan: Acute metabolic encephalopathy  At baseline family had not noticed any dementia at home Presented with 24 hours of confusion, hallucination with disorientation and aggressive behavior. Multiple possible etiologies were considered CT head/MRI brain without acute abnormality, but showed chronic mild small vessel ischemic disease TSH mildly elevated but T4 normal.  Ammonia, CRP normal, folic acid level normal.  Vitamin B12 on the lower side of normal at 207. Urinalysis with trace leukocytes, rare bacteria, unclear if clearly has UTI. Currently covered with empiric IV Rocephin . In the last 24 hours, patient has had episodes of disorientation, agitation, has refused care, tried to hit staff. It seems, patient did not sleep at all last night despite trazodone , melatonin. Per granddaughter at bedside, this morning, she took a nap earlier in the morning and later participated with PT. was confused again at the time of my evaluation.  Mental status has been fluctuating. For tonight, will give trazodone  50 mg and melatonin 5 mg  Large left mastoid effusion Seen on CT head Clinically, no suspicion of sinusitis.  Low vitamin B12 Vitamin B12 level on the lower side of normal at 207.  Oral replacement started Recent Labs    11/05/24 0544 11/05/24 1638 11/06/24 0655  MCV 92.3  --  90.6  VITAMINB12  --  207  --   FOLATE  --  12.4  --     CKD 5 Creatinine at baseline. Recent Labs    09/17/24 2025 11/04/24 2312 11/05/24 0544 11/06/24 0655  BUN 29* 28*  --  27*  CREATININE 1.29* 1.18* 1.14* 1.31*  CO2 21* 20*  --  18*   Type 2 diabetes mellitus with hyperglycemia A1c 6.2 on 11/05/2024 PTA  meds-unclear at this time Currently on SSI/Accu-Cheks Recent Labs  Lab 11/05/24 1202 11/05/24 1752 11/05/24 2041 11/06/24 0729 11/06/24 1153  GLUCAP 154* 108* 172* 225* 163*   Essential hypertension Blood pressure widely fluctuating. PTA meds-Toprol  100 mg daily, amlodipine  10 mg daily,  Lasix  20 mg daily, lisinopril  20 mg/40 mg -am/pm Will continue Toprol  and amlodipine  but hold Lasix  and lisinopril  given risk of dehydration at her age Continue PRN IV hydralazine    Hyperlipidemia Continue atorvastatin    Hypothyroidism Slightly elevated TSH but normal free T4.   Continue home dose Synthroid    Nutrition Status:         Mobility: Independent at baseline.  Weak currently.  Seen by PT.  SNF recommended.  PT Orders: Active   PT Follow up Rec: Skilled Nursing-Short Term Rehab (<3 Hours/Day)11/06/2024 1058    Goals of care   Code Status: Full Code     DVT prophylaxis:  heparin  injection 5,000 Units Start: 11/05/24 0730 SCDs Start: 11/05/24 0715 SCDs Start: 11/05/24 0408   Antimicrobials: IV Rocephin  Fluid: NS at 100 mL/h Consultants: None Family Communication: Granddaughter at bedside  Status: Observation Level of care:  Telemetry   Patient is from: Home Needs to continue in-hospital care: On IV antibiotics, ongoing workup.  Remains confused Anticipated d/c to: SNF recommended by PT    Diet:  Diet Order             Diet regular Room service appropriate? Yes; Fluid consistency: Thin  Diet effective now                   Scheduled Meds:  amLODipine   10 mg Oral Daily   atorvastatin   10 mg Oral QHS   vitamin B-12  1,000 mcg Oral Daily   ezetimibe   10 mg Oral Daily   heparin   5,000 Units Subcutaneous Q8H   insulin  aspart  0-9 Units Subcutaneous TID WC   levothyroxine   75 mcg Oral Q0600   melatonin  5 mg Oral QHS   metoprolol  succinate  100 mg Oral Daily   ondansetron  (ZOFRAN ) IV  4 mg Intravenous Once   sodium chloride  flush  3 mL Intravenous Q12H   sodium chloride  flush  3 mL Intravenous Q12H   traZODone   50 mg Oral QHS    PRN meds: acetaminophen  **OR** acetaminophen , bisacodyl , hydrALAZINE , HYDROmorphone  (DILAUDID ) injection, ipratropium, ondansetron  **OR** ondansetron  (ZOFRAN ) IV, oxyCODONE , senna-docusate, sodium phosphate    Infusions:   sodium chloride  1,000 mL (11/06/24 1012)   cefTRIAXone  (ROCEPHIN )  IV 1 g (11/06/24 1009)    Antimicrobials: Anti-infectives (From admission, onward)    Start     Dose/Rate Route Frequency Ordered Stop   11/05/24 0800  cefTRIAXone  (ROCEPHIN ) 1 g in sodium chloride  0.9 % 100 mL IVPB        1 g 200 mL/hr over 30 Minutes Intravenous Every 24 hours 11/05/24 0717 11/08/24 0759       Objective: Vitals:   11/06/24 1011 11/06/24 1012  BP: (!) 144/78 (!) 144/78  Pulse: 78   Resp:    Temp:    SpO2:      Intake/Output Summary (Last 24 hours) at 11/06/2024 1304 Last data filed at 11/06/2024 0243 Gross per 24 hour  Intake 585.91 ml  Output --  Net 585.91 ml   Filed Weights   11/05/24 0400  Weight: 63 kg   Weight change:  Body mass index is 29.03 kg/m.   Physical Exam: General exam: Pleasant, elderly Caucasian female. Skin: No rashes, lesions  or ulcers. HEENT: Atraumatic, normocephalic, no obvious bleeding Lungs: Clear to auscultation bilaterally,  CVS: S1, S2, no murmur,   GI/Abd: Soft, nontender, nondistended, bowel sound present,   CNS: Alert, awake, confused, mumbles, unable to follow commands for me Psychiatry: Sad affect Extremities: No pedal edema, no calf tenderness,  Data Review: I have personally reviewed the laboratory data and studies available.  F/u labs ordered Unresulted Labs (From admission, onward)     Start     Ordered   11/05/24 1730  T3, free  Once,   R        11/05/24 1730   11/05/24 0715  Expectorated Sputum Assessment w Gram Stain, Rflx to Resp Cult  Once,   R       Comments: If productive cough    11/05/24 0716            Signed, Chapman Rota, MD [facility] 11/06/2024  "

## 2024-11-07 DIAGNOSIS — G9340 Encephalopathy, unspecified: Secondary | ICD-10-CM | POA: Diagnosis not present

## 2024-11-07 LAB — GLUCOSE, CAPILLARY
Glucose-Capillary: 191 mg/dL — ABNORMAL HIGH (ref 70–99)
Glucose-Capillary: 156 mg/dL — ABNORMAL HIGH (ref 70–99)
Glucose-Capillary: 170 mg/dL — ABNORMAL HIGH (ref 70–99)
Glucose-Capillary: 144 mg/dL — ABNORMAL HIGH (ref 70–99)

## 2024-11-07 LAB — T3, FREE: T3, Free: 1.9 pg/mL — ABNORMAL LOW (ref 2.0–4.4)

## 2024-11-07 MED ORDER — GLIPIZIDE ER 5 MG PO TB24
5.0000 mg | ORAL_TABLET | Freq: Every day | ORAL | Status: DC
  Administered 2024-11-07 – 2024-11-08 (×2): 5 mg via ORAL
  Filled 2024-11-07 (×2): qty 1

## 2024-11-07 MED ORDER — SODIUM CHLORIDE 0.9 % IV SOLN: INTRAVENOUS | Status: DC

## 2024-11-07 NOTE — Plan of Care (Signed)
  Problem: Skin Integrity: Goal: Risk for impaired skin integrity will decrease Outcome: Progressing   Problem: Tissue Perfusion: Goal: Adequacy of tissue perfusion will improve Outcome: Progressing   Problem: Pain Managment: Goal: General experience of comfort will improve and/or be controlled Outcome: Progressing

## 2024-11-07 NOTE — TOC Progression Note (Signed)
 Transition of Care [facility]) - Progression Note    Patient Details  Name: Stacy Valentine MRN: [id number] Date of Birth: 10/28/1925  Transition of [facility]) CM/SW Contact  Toy LITTIE Agar, RN Phone [phone]  11/07/2024, 4:53 PM  Clinical Narrative:    Inpatient care manager continues to follow for appropriateness to initiate bed search (confusion, hallucination with disorientation and aggressive behavior) MD following mental status. Patient is not at baseline.    Expected Discharge Plan: Skilled Nursing Facility Barriers to Discharge: Continued Medical Work up               Expected Discharge Plan and Services In-house Referral: NA Discharge Planning Services: CM Consult Post Acute Care Choice: Skilled Nursing Facility Living arrangements for the past 2 months: Single Family Home                 DME Arranged: N/A DME Agency: NA       HH Arranged: NA HH Agency: NA         Social Drivers of Health (SDOH) Interventions SDOH Screenings   Food Insecurity: No Food Insecurity (11/05/2024)  Housing: Low Risk (11/05/2024)  Transportation Needs: No Transportation Needs (11/05/2024)  Utilities: Not At Risk (11/05/2024)  Social Connections: Patient Unable To Answer (11/06/2024)  Tobacco Use: Low Risk (11/05/2024)    Readmission Risk Interventions     No data to display

## 2024-11-07 NOTE — Progress Notes (Signed)
 " PROGRESS NOTE  Stacy Valentine  DOB: 1925-11-26  PCP: Cleotilde Planas, MD FMW:[id number]  DOA: 11/04/2024  LOS: 1 day  Hospital Day: 4  Subjective: Patient was seen and examined this morning. Lying on bed. Daughter at bedside.  She states that patient fell asleep through last night and gradually waking up this morning.  At times, she would wake up to answer questions to daughter. On my exam, patient tries to open eyes on command but falls right back asleep Afebrile, heart rate in 140s and 150s mostly.  Breathing on room air Blood sugar level close to 200 mostly  Brief narrative: Stacy Valentine is a 89 y.o. female with PMH significant for DM2, HTN, HLD, hypothyroidism, osteoporosis. Patient lives at home.  At baseline able to take care of daily activities and did not have any symptoms or signs of dementia. 1/10, patient was brought to the ED by EMS for rapidly progressive confusion, disorientation, hallucination aggressive behavior. Per family, symptoms started around 24 hours prior to presentation. No fever, cough, dysuria, any other localizing symptoms of infection.  In the ED, patient was afebrile, heart rate 60s and 70s, initial blood pressure was elevated to 179/69, breathing on room air. Initial labs with WC count normal at 7.1, BUN/creatinine 28/1.18, lactic acid 1 Respiratory virus panel unremarkable. Urinalysis with clear straw-colored urine with trace leukocytes, rare bacteria Urine culture sent Chest x-ray unremarkable CT head unremarkable MRI brain did not show any evidence of acute intracranial abnormality, showed mild chronic small vessel ischemic disease.  Admitted to [facility] for further evaluation of encephalopathy.  Assessment and plan: Acute metabolic encephalopathy  UTI At baseline family had not noticed any dementia at home Presented with 24 hours of confusion, hallucination with disorientation and aggressive behavior. Multiple possible etiologies were  considered CT head/MRI brain without acute abnormality, but showed chronic mild small vessel ischemic disease TSH mildly elevated but T4 normal.  Ammonia, CRP normal, folic acid level normal.  Vitamin B12 on the lower side of normal at 207. Urinalysis with trace leukocytes, rare bacteria, unclear if clearly has UTI. Currently covered with empiric IV Rocephin . Since admission, patient has had episodes of disorientation, agitation, has refused care, tried to hit staff. Last night, she was given trazodone  50 mg and melatonin 5 mg.  Slept through the night.  Large left mastoid effusion Seen on CT head Clinically, no suspicion of sinusitis.  Low vitamin B12 Vitamin B12 level on the lower side of normal at 207.  Oral replacement started Recent Labs    11/05/24 0544 11/05/24 1638 11/06/24 0655  MCV 92.3  --  90.6  VITAMINB12  --  207  --   FOLATE  --  12.4  --     CKD 5 Creatinine remains at baseline. Recent Labs    09/17/24 2025 11/04/24 2312 11/05/24 0544 11/06/24 0655  BUN 29* 28*  --  27*  CREATININE 1.29* 1.18* 1.14* 1.31*  CO2 21* 20*  --  18*   Type 2 diabetes mellitus with hyperglycemia A1c 6.2 on 11/05/2024 PTA meds-glipizide  10 mg daily, Januvia 50 mg daily Currently on SSI/Accu-Cheks only. Blood sugar level consistently elevated close to 200.  I would resume glipizide  at a lower dose of 5 mg daily Recent Labs  Lab 11/06/24 1153 11/06/24 1613 11/06/24 2159 11/07/24 0754 11/07/24 1118  GLUCAP 163* 101* 198* 191* 156*   Essential hypertension Blood pressure widely fluctuating. PTA meds-Toprol  100 mg daily, amlodipine  10 mg daily, Lasix  20 mg daily, lisinopril   20 mg/40 mg -am/pm Currently continued on Toprol  and amlodipine .  Lasix  and lisinopril  are on hold given risk of dehydration at her age. Hemodynamically stable. Continue PRN IV hydralazine    Hyperlipidemia Continue atorvastatin    Hypothyroidism Slightly elevated TSH but normal free T4.   Continue home  dose Synthroid    Nutrition Status:         Mobility: Independent at baseline.  Weak currently.  Seen by PT.  SNF recommended.  PT Orders: Active   PT Follow up Rec: Skilled Nursing-Short Term Rehab (<3 Hours/Day)11/06/2024 1058    Goals of care   Code Status: Full Code     DVT prophylaxis:  heparin  injection 5,000 Units Start: 11/05/24 0730 SCDs Start: 11/05/24 0715 SCDs Start: 11/05/24 0408   Antimicrobials: IV Rocephin  Fluid: NS at 100 mL/h.  I would reduce to 50 mL per Consultants: None Family Communication: Daughter at bedside  Status: Observation Level of care:  Med-Surg   Patient is from: Home Needs to continue in-hospital care: On IV antibiotics.  Very sleepy this morning.  Continue to monitor mental status Anticipated d/c to: SNF recommended by PT    Diet:  Diet Order             Diet regular Room service appropriate? Yes; Fluid consistency: Thin  Diet effective now                   Scheduled Meds:  amLODipine   10 mg Oral Daily   atorvastatin   10 mg Oral QHS   vitamin B-12  1,000 mcg Oral Daily   ezetimibe   10 mg Oral Daily   glipiZIDE   5 mg Oral QAC breakfast   heparin   5,000 Units Subcutaneous Q8H   insulin  aspart  0-9 Units Subcutaneous TID WC   levothyroxine   75 mcg Oral Q0600   melatonin  5 mg Oral QHS   metoprolol  succinate  100 mg Oral Daily   ondansetron  (ZOFRAN ) IV  4 mg Intravenous Once   sodium chloride  flush  3 mL Intravenous Q12H   sodium chloride  flush  3 mL Intravenous Q12H   traZODone   50 mg Oral QHS    PRN meds: acetaminophen  **OR** acetaminophen , bisacodyl , hydrALAZINE , HYDROmorphone  (DILAUDID ) injection, ipratropium, ondansetron  **OR** ondansetron  (ZOFRAN ) IV, oxyCODONE , senna-docusate, sodium phosphate   Infusions:   sodium chloride        Antimicrobials: Anti-infectives (From admission, onward)    Start     Dose/Rate Route Frequency Ordered Stop   11/05/24 0800  cefTRIAXone  (ROCEPHIN ) 1 g in sodium chloride   0.9 % 100 mL IVPB        1 g 200 mL/hr over 30 Minutes Intravenous Every 24 hours 11/05/24 0717 11/07/24 0836       Objective: Vitals:   11/06/24 2157 11/07/24 1048  BP: (!) 155/104 (!) 191/71  Pulse: 81   Resp: 18   Temp: 98.1 F (36.7 C)   SpO2: 98%     Intake/Output Summary (Last 24 hours) at 11/07/2024 1316 Last data filed at 11/07/2024 0500 Gross per 24 hour  Intake --  Output 375 ml  Net -375 ml   Filed Weights   11/05/24 0400 11/07/24 0500  Weight: 63 kg 61.2 kg   Weight change:  Body mass index is 28.2 kg/m.   Physical Exam: General exam: Pleasant, elderly Caucasian female. Skin: No rashes, lesions or ulcers. HEENT: Atraumatic, normocephalic, no obvious bleeding Lungs: Clear to auscultation bilaterally,  CVS: S1, S2, no murmur,   GI/Abd: Soft, nontender, nondistended, bowel  sound present,   CNS: Sleepy, tries to open eyes on command and falls right back asleep Extremities: No pedal edema, no calf tenderness,  Data Review: I have personally reviewed the laboratory data and studies available.  F/u labs ordered Unresulted Labs (From admission, onward)     Start     Ordered   11/05/24 0715  Expectorated Sputum Assessment w Gram Stain, Rflx to Resp Cult  Once,   R       Comments: If productive cough    11/05/24 0716   Unscheduled  Basic metabolic panel with GFR  Tomorrow morning,   R       Question:  Specimen collection method  Answer:  Lab=Lab collect   11/07/24 1316            Signed, Chapman Rota, MD [facility] 11/07/2024  "

## 2024-11-08 DIAGNOSIS — G9340 Encephalopathy, unspecified: Secondary | ICD-10-CM | POA: Diagnosis not present

## 2024-11-08 LAB — GLUCOSE, CAPILLARY
Glucose-Capillary: 198 mg/dL — ABNORMAL HIGH (ref 70–99)
Glucose-Capillary: 215 mg/dL — ABNORMAL HIGH (ref 70–99)
Glucose-Capillary: 162 mg/dL — ABNORMAL HIGH (ref 70–99)
Glucose-Capillary: 282 mg/dL — ABNORMAL HIGH (ref 70–99)

## 2024-11-08 MED ORDER — CEFADROXIL 500 MG PO CAPS
500.0000 mg | ORAL_CAPSULE | Freq: Two times a day (BID) | ORAL | Status: DC
  Administered 2024-11-08 – 2024-11-09 (×3): 500 mg via ORAL
  Filled 2024-11-08 (×3): qty 1

## 2024-11-08 MED ORDER — GLIPIZIDE ER 10 MG PO TB24: 10.0000 mg | ORAL_TABLET | Freq: Every day | ORAL | Status: DC

## 2024-11-08 MED ORDER — MELATONIN 3 MG PO TABS
3.0000 mg | ORAL_TABLET | Freq: Every day | ORAL | Status: DC
  Administered 2024-11-08: 3 mg via ORAL
  Filled 2024-11-08: qty 1

## 2024-11-08 MED ORDER — LINAGLIPTIN 5 MG PO TABS
5.0000 mg | ORAL_TABLET | Freq: Every day | ORAL | Status: DC
  Administered 2024-11-08 – 2024-11-09 (×2): 5 mg via ORAL
  Filled 2024-11-08 (×2): qty 1

## 2024-11-08 NOTE — Progress Notes (Addendum)
 " PROGRESS NOTE  Stacy Valentine  DOB: December 02, 1925  PCP: Cleotilde Planas, MD FMW:[id number]  DOA: 11/04/2024  LOS: 2 days  Hospital Day: 5  Subjective: Patient was seen and examined this morning. Propped up in bed.  Sleeping.  Granddaughter at bedside. She stated that patient was up earlier and took her breakfast and slept again.  She has been sleeping good at night and also several hours during the day for the last 2 days.  But when she wakes up, her mental status is clear and she is able to have meaningful conversation. Afebrile, hemodynamically stable, breathing on room air. Blood glucose level 198 this morning  Seen again this afternoon.  Patient awake, participating with physical therapy.  Daughter at bedside.  She wants a speech eval prior to discharge.  Brief narrative: Stacy Valentine is a 89 y.o. female with PMH significant for DM2, HTN, HLD, hypothyroidism, osteoporosis. Patient lives at home.  At baseline able to take care of daily activities and did not have any symptoms or signs of dementia. 1/10, patient was brought to the ED by EMS for rapidly progressive confusion, disorientation, hallucination aggressive behavior. Per family, symptoms started around 24 hours prior to presentation. No fever, cough, dysuria, any other localizing symptoms of infection.  In the ED, patient was afebrile, heart rate 60s and 70s, initial blood pressure was elevated to 179/69, breathing on room air. Initial labs with WC count normal at 7.1, BUN/creatinine 28/1.18, lactic acid 1 Respiratory virus panel unremarkable. Urinalysis with clear straw-colored urine with trace leukocytes, rare bacteria Urine culture sent Chest x-ray unremarkable CT head unremarkable MRI brain did not show any evidence of acute intracranial abnormality, showed mild chronic small vessel ischemic disease.  Admitted to [facility] for further evaluation of encephalopathy.  Assessment and plan: Acute metabolic encephalopathy   UTI At baseline family had not noticed any dementia at home Presented with 24 hours of confusion, hallucination with disorientation and aggressive behavior. Multiple possible etiologies were considered CT head/MRI brain without acute abnormality, but showed chronic mild small vessel ischemic disease TSH mildly elevated but T4 normal.  Ammonia, CRP normal, folic acid level normal.  Vitamin B12 on the lower side of normal at 207. Urinalysis with trace leukocytes, rare bacteria, unclear if clearly has UTI. Received 3 days of empiric IV Rocephin .  I would do 2 more days of oral Duricef. First 2 days, patient has had episodes of disorientation, agitation, has refused care, tried to hit staff. She was initiated on trazodone  50 mg and melatonin 5 mg after which she has slept well and mental status has gradually improved. She has been sleeping good at night and also several hours during the day for the last 2 days.  But when she wakes up, her mental status is clear and she is able to have meaningful conversation. Completely awake this afternoon. I will stop trazodone  and reduce dose of melatonin to 3 mg for tonight..  Large left mastoid effusion Seen on CT head Clinically, no suspicion of sinusitis.  Low vitamin B12 Vitamin B12 level on the lower side of normal at 207.  Oral replacement started Recent Labs    11/05/24 0544 11/05/24 1638 11/06/24 0655  MCV 92.3  --  90.6  VITAMINB12  --  207  --   FOLATE  --  12.4  --     CKD 5 Creatinine remains at baseline. Repeat labs tomorrow Recent Labs    09/17/24 2025 11/04/24 2312 11/05/24 0544 11/06/24 0655  BUN  29* 28*  --  27*  CREATININE 1.29* 1.18* 1.14* 1.31*  CO2 21* 20*  --  18*   Type 2 diabetes mellitus with hyperglycemia A1c 6.2 on 11/05/2024 PTA meds-glipizide  10 mg daily, Januvia 50 mg daily Currently on glipizide  at a lower dose of 5 mg daily. Blood sugar level consistently elevated close to 200.  Given her high risk of  hypoglycemia with CKD 5, I would stop glipizide .  Resume Januvia. Currently on SSI/Accu-Cheks only. Recent Labs  Lab 11/07/24 1118 11/07/24 1706 11/07/24 2146 11/08/24 0740 11/08/24 1202  GLUCAP 156* 170* 144* 198* 215*   Essential hypertension Blood pressure widely fluctuating. PTA meds-Toprol  100 mg daily, amlodipine  10 mg daily, Lasix  20 mg daily, lisinopril  20 mg/40 mg -am/pm Currently continued on Toprol  and amlodipine .  Lasix  and lisinopril  are on hold given risk of dehydration at her age. Hemodynamically stable. Continue PRN IV hydralazine    Hyperlipidemia Continue atorvastatin    Hypothyroidism Slightly elevated TSH but normal free T4.   Continue home dose Synthroid   Dysphagia Family is concerned about aspiration.  SLP eval ordered.    Mobility: Independent at baseline.  Weak currently.  Seen by PT.  SNF recommended.  Family hoping that her mobility should have improved now.  Ultimately hoping to take home and not to rehab  PT Orders: Active   PT Follow up Rec: Home Health Pt ([facility])11/08/2024 1600    Goals of care   Code Status: Full Code     DVT prophylaxis:  heparin  injection 5,000 Units Start: 11/05/24 0730 SCDs Start: 11/05/24 0715 SCDs Start: 11/05/24 0408   Antimicrobials: IV Rocephin  Fluid: Stop IV fluid Consultants: None Family Communication: Granddaughter at bedside  Status: Inpatient Level of care:  Med-Surg   Patient is from: Home Needs to continue in-hospital care: Improving mental status.  Pending speech eval.  Hopefully home tomorrow   Diet:  Diet Order             Diet regular Room service appropriate? Yes; Fluid consistency: Thin  Diet effective now                   Scheduled Meds:  amLODipine   10 mg Oral Daily   atorvastatin   10 mg Oral QHS   cefadroxil   500 mg Oral BID   vitamin B-12  1,000 mcg Oral Daily   ezetimibe   10 mg Oral Daily   heparin   5,000 Units Subcutaneous Q8H   insulin  aspart  0-9 Units  Subcutaneous TID WC   levothyroxine   75 mcg Oral Q0600   linagliptin   5 mg Oral Daily   melatonin  3 mg Oral QHS   metoprolol  succinate  100 mg Oral Daily   ondansetron  (ZOFRAN ) IV  4 mg Intravenous Once   sodium chloride  flush  3 mL Intravenous Q12H   sodium chloride  flush  3 mL Intravenous Q12H    PRN meds: acetaminophen  **OR** acetaminophen , bisacodyl , hydrALAZINE , HYDROmorphone  (DILAUDID ) injection, ipratropium, ondansetron  **OR** ondansetron  (ZOFRAN ) IV, senna-docusate, sodium phosphate   Infusions:      Antimicrobials: Anti-infectives (From admission, onward)    Start     Dose/Rate Route Frequency Ordered Stop   11/08/24 1230  cefadroxil  (DURICEF) capsule 500 mg        500 mg Oral 2 times daily 11/08/24 1130     11/05/24 0800  cefTRIAXone  (ROCEPHIN ) 1 g in sodium chloride  0.9 % 100 mL IVPB        1 g 200 mL/hr over 30 Minutes Intravenous  Every 24 hours 11/05/24 0717 11/07/24 0836       Objective: Vitals:   11/07/24 2150 11/08/24 1426  BP:  (!) 167/65  Pulse: 79 75  Resp:  14  Temp: 98.1 F (36.7 C) 98.1 F (36.7 C)  SpO2: 97% 96%    Intake/Output Summary (Last 24 hours) at 11/08/2024 1643 Last data filed at 11/08/2024 1300 Gross per 24 hour  Intake 460 ml  Output 500 ml  Net -40 ml   Filed Weights   11/05/24 0400 11/07/24 0500 11/08/24 0500  Weight: 63 kg 61.2 kg 62 kg   Weight change: 0.8 kg Body mass index is 28.57 kg/m.   Physical Exam: General exam: Pleasant, elderly Caucasian female.  Not in physical distress Skin: No rashes, lesions or ulcers. HEENT: Atraumatic, normocephalic, no obvious bleeding Lungs: Clear to auscultation bilaterally,  CVS: S1, S2, no murmur,   GI/Abd: Soft, nontender, nondistended, bowel sound present,   CNS: Alert awake, able to have a conversation this afternoon  Extremities: No pedal edema, no calf tenderness,  Data Review: I have personally reviewed the laboratory data and studies available.  F/u labs  ordered Unresulted Labs (From admission, onward)     Start     Ordered   11/09/24 0500  Basic metabolic panel with GFR  Tomorrow morning,   R       Question:  Specimen collection method  Answer:  Lab=Lab collect   11/08/24 1043   11/09/24 0500  CBC with Differential/Platelet  Tomorrow morning,   R       Question:  Specimen collection method  Answer:  Lab=Lab collect   11/08/24 1043   11/08/24 0500  Basic metabolic panel with GFR  Tomorrow morning,   R       Question:  Specimen collection method  Answer:  Lab=Lab collect   11/07/24 1316   11/05/24 0715  Expectorated Sputum Assessment w Gram Stain, Rflx to Resp Cult  Once,   R       Comments: If productive cough    11/05/24 0716            Signed, Chapman Rota, MD [facility] 11/08/2024  "

## 2024-11-08 NOTE — Progress Notes (Signed)
 Physical Therapy Treatment Patient Details Name: Stacy Valentine MRN: [id number] DOB: [birth year] Today's Date: 11/08/2024   History of Present Illness Stacy Valentine is a 89 year old female presenting with chief complaint of confusion, hallucination on 11/04/24.   Pt dx with acute encephalopathy and sepsis secondary to UTI.  MRI of head was negative for acute infarct. PMH: HTN, ILD, DM2, hypothyroidism, osteoporosis.    PT Comments  Pt sleeping on arrival, aroused to voice, pleasant and agreeable to PT. Pt min assist this session for functional tasks and amb x 30' with RW. Family feels they can manage at home with pt at min assist level. Pt coughing with small sips of water and dry swallow during PT session, Dr Arlice present during PT session and ordering Speech Therapy eval for tomorrow.  Pt will benefit from HHPT and HHOT at d/c. No DME needs. Will continue to follow in acute setting   If plan is discharge home, recommend the following: A little help with walking and/or transfers;A little help with bathing/dressing/bathroom;Assist for transportation;Direct supervision/assist for financial management;Direct supervision/assist for medications management;Help with stairs or ramp for entrance;Supervision due to cognitive status   Can travel by private vehicle        Equipment Recommendations  None recommended by PT    Recommendations for Other Services       Precautions / Restrictions Precautions Precautions: Fall Precaution/Restrictions Comments: recent fall at home.knees buckled per family Restrictions Weight Bearing Restrictions Per Provider Order: No     Mobility  Bed Mobility Overal bed mobility: Needs Assistance Bed Mobility: Supine to Sit     Supine to sit: Min assist, HOB elevated     General bed mobility comments: incr time, multi-modal  cues to sequence and complete transition. able to scoot to EOB without assist when give extra time.    Transfers Overall  transfer level: Needs assistance Equipment used: Rolling walker (2 wheels) Transfers: Sit to/from Stand, Bed to chair/wheelchair/BSC Sit to Stand: Min assist   Step pivot transfers: Min assist       General transfer comment: cues for hand placement to control descent and  overall safety    Ambulation/Gait Ambulation/Gait assistance: Min assist Gait Distance (Feet): 30 Feet Assistive device: Rolling walker (2 wheels) Gait Pattern/deviations: Decreased step length - right, Decreased step length - left, Trunk flexed Gait velocity: decr     General Gait Details: cues for trunk/hip  extension and RW position.  assist to steady and manage RW, LOB x1 with min assist to recover   Stairs             Wheelchair Mobility     Tilt Bed    Modified Rankin (Stroke Patients Only)       Balance                                            Communication Communication Factors Affecting Communication: Difficulty expressing self  Cognition Arousal: Alert Behavior During Therapy: Impulsive (mildly impulsive, easily redirected)   PT - Cognitive impairments: History of cognitive impairments                       PT - Cognition Comments: mentation closer to baseline per family, follows one step commands with multi-modal cues        Cueing Cueing Techniques: Verbal cues, Gestural cues, Tactile cues, Visual cues  Exercises      General Comments        Pertinent Vitals/Pain Pain Assessment Pain Assessment: No/denies pain    Home Living                          Prior Function            PT Goals (current goals can now be found in the care plan section) Acute Rehab PT Goals Patient Stated Goal: granddaughter acknowledged may need rehab but hopeful to progress if able PT Goal Formulation: With patient/family Time For Goal Achievement: 11/20/24 Potential to Achieve Goals: Good Progress towards PT goals: Progressing toward goals     Frequency    Min 3X/week      PT Plan      Co-evaluation              AM-PAC PT 6 Clicks Mobility   Outcome Measure  Help needed turning from your back to your side while in a flat bed without using bedrails?: A Little Help needed moving from lying on your back to sitting on the side of a flat bed without using bedrails?: A Little Help needed moving to and from a bed to a chair (including a wheelchair)?: A Little Help needed standing up from a chair using your arms (e.g., wheelchair or bedside chair)?: A Little Help needed to walk in hospital room?: A Little Help needed climbing 3-5 steps with a railing? : A Lot 6 Click Score: 17    End of Session Equipment Utilized During Treatment: Gait belt Activity Tolerance: Patient tolerated treatment well Patient left: in chair;with call bell/phone within reach;with chair alarm set;with family/visitor present Nurse Communication: Mobility status PT Visit Diagnosis: Other abnormalities of gait and mobility (R26.89);Muscle weakness (generalized) (M62.81)     Time: 8449-8386 PT Time Calculation (min) (ACUTE ONLY): 23 min  Charges:    $Gait Training: 8-22 mins $Therapeutic Activity: 8-22 mins PT General Charges $$ ACUTE PT VISIT: 1 Visit                     Emree Locicero, PT  Acute Rehab Dept [facility]) [phone]  11/08/2024    [facility] 11/08/2024, 4:25 PM

## 2024-11-08 NOTE — Plan of Care (Signed)
" °  Problem: Education: Goal: Ability to describe self-care measures that may prevent or decrease complications (Diabetes Survival Skills Education) will improve Outcome: Progressing Goal: Individualized Educational Video(s) Outcome: Progressing   Problem: Coping: Goal: Ability to adjust to condition or change in health will improve Outcome: Progressing   Problem: Fluid Volume: Goal: Ability to maintain a balanced intake and output will improve Outcome: Progressing   Problem: Health Behavior/Discharge Planning: Goal: Ability to identify and utilize available resources and services will improve Outcome: Progressing Goal: Ability to manage health-related needs will improve Outcome: Progressing   Problem: Metabolic: Goal: Ability to maintain appropriate glucose levels will improve Outcome: Progressing   Problem: Nutritional: Goal: Maintenance of adequate nutrition will improve Outcome: Progressing Goal: Progress toward achieving an optimal weight will improve Outcome: Progressing   Problem: Tissue Perfusion: Goal: Adequacy of tissue perfusion will improve Outcome: Progressing   Problem: Skin Integrity: Goal: Risk for impaired skin integrity will decrease Outcome: Progressing   Problem: Clinical Measurements: Goal: Ability to maintain clinical measurements within normal limits will improve Outcome: Progressing Goal: Will remain free from infection Outcome: Progressing Goal: Diagnostic test results will improve Outcome: Progressing Goal: Respiratory complications will improve Outcome: Progressing Goal: Cardiovascular complication will be avoided Outcome: Progressing   Problem: Nutrition: Goal: Adequate nutrition will be maintained Outcome: Progressing   "

## 2024-11-08 NOTE — Plan of Care (Signed)
  Problem: Skin Integrity: Goal: Risk for impaired skin integrity will decrease Outcome: Progressing   Problem: Coping: Goal: Level of anxiety will decrease Outcome: Progressing

## 2024-11-09 DIAGNOSIS — G9340 Encephalopathy, unspecified: Secondary | ICD-10-CM | POA: Diagnosis not present

## 2024-11-09 LAB — CBC WITH DIFFERENTIAL/PLATELET
Abs Immature Granulocytes: 0.13 K/uL — ABNORMAL HIGH (ref 0.00–0.07)
Basophils Absolute: 0.1 K/uL (ref 0.0–0.1)
Basophils Relative: 1 %
Eosinophils Absolute: 0.2 K/uL (ref 0.0–0.5)
Eosinophils Relative: 2 %
HCT: 34.1 % — ABNORMAL LOW (ref 36.0–46.0)
Hemoglobin: 11.4 g/dL — ABNORMAL LOW (ref 12.0–15.0)
Immature Granulocytes: 2 %
Lymphocytes Relative: 18 %
Lymphs Abs: 1.4 K/uL (ref 0.7–4.0)
MCH: 31 pg (ref 26.0–34.0)
MCHC: 33.4 g/dL (ref 30.0–36.0)
MCV: 92.7 fL (ref 80.0–100.0)
Monocytes Absolute: 0.7 K/uL (ref 0.1–1.0)
Monocytes Relative: 9 %
Neutro Abs: 5.5 K/uL (ref 1.7–7.7)
Neutrophils Relative %: 68 %
Platelets: 211 K/uL (ref 150–400)
RBC: 3.68 MIL/uL — ABNORMAL LOW (ref 3.87–5.11)
RDW: 13.9 % (ref 11.5–15.5)
WBC: 7.9 K/uL (ref 4.0–10.5)
nRBC: 0 % (ref 0.0–0.2)

## 2024-11-09 LAB — BASIC METABOLIC PANEL WITH GFR
Anion gap: 9 (ref 5–15)
BUN: 31 mg/dL — ABNORMAL HIGH (ref 8–23)
CO2: 21 mmol/L — ABNORMAL LOW (ref 22–32)
Calcium: 9.4 mg/dL (ref 8.9–10.3)
Chloride: 116 mmol/L — ABNORMAL HIGH (ref 98–111)
Creatinine, Ser: 1.39 mg/dL — ABNORMAL HIGH (ref 0.44–1.00)
GFR, Estimated: 35 mL/min — ABNORMAL LOW
Glucose, Bld: 206 mg/dL — ABNORMAL HIGH (ref 70–99)
Potassium: 3.7 mmol/L (ref 3.5–5.1)
Sodium: 145 mmol/L (ref 135–145)

## 2024-11-09 LAB — GLUCOSE, CAPILLARY
Glucose-Capillary: 198 mg/dL — ABNORMAL HIGH (ref 70–99)
Glucose-Capillary: 226 mg/dL — ABNORMAL HIGH (ref 70–99)

## 2024-11-09 MED ORDER — ORAL CARE MOUTH RINSE: 15.0000 mL | OROMUCOSAL | Status: DC | PRN

## 2024-11-09 MED ORDER — METOPROLOL TARTRATE 50 MG PO TABS
50.0000 mg | ORAL_TABLET | Freq: Two times a day (BID) | ORAL | Status: DC
  Administered 2024-11-09: 50 mg via ORAL
  Filled 2024-11-09: qty 1

## 2024-11-09 MED ORDER — GLIPIZIDE ER 5 MG PO TB24: 5.0000 mg | ORAL_TABLET | Freq: Every morning | ORAL | 0 refills | Status: AC

## 2024-11-09 MED ORDER — MELATONIN 3 MG PO TABS: 3.0000 mg | ORAL_TABLET | Freq: Every evening | ORAL | 0 refills | Status: DC | PRN

## 2024-11-09 MED ORDER — LISINOPRIL 20 MG PO TABS: 20.0000 mg | ORAL_TABLET | Freq: Every evening | ORAL | Status: AC

## 2024-11-09 MED ORDER — CYANOCOBALAMIN 1000 MCG PO TABS: 1000.0000 ug | ORAL_TABLET | Freq: Every day | ORAL | 0 refills | Status: AC

## 2024-11-09 NOTE — Progress Notes (Signed)
 Pt is alert and oriented x 3, able to follow commands, afebrile, stable hemodynamically, on room air, no acute distress noted overnight. Pt is able to rest and sleep well with no major complaints. Family member is at bedside very supportive and helpful. Pt's daughter stated that Pt's mental status is back to her baseline.  Plan of care is reviewed. Pt has been progressing. We will continue to monitor.   Problem: Health Behavior/Discharge Planning: Goal: Ability to identify and utilize available resources and services will improve Outcome: Progressing Goal: Ability to manage health-related needs will improve Outcome: Progressing   Problem: Clinical Measurements: Goal: Ability to maintain clinical measurements within normal limits will improve Outcome: Progressing Goal: Will remain free from infection Outcome: Progressing Goal: Diagnostic test results will improve Outcome: Progressing Goal: Respiratory complications will improve Outcome: Progressing Goal: Cardiovascular complication will be avoided Outcome: Progressing   Problem: Activity: Goal: Risk for activity intolerance will decrease Outcome: Progressing   Problem: Nutrition: Goal: Adequate nutrition will be maintained Outcome: Progressing   Problem: Metabolic: Goal: Ability to maintain appropriate glucose levels will improve Outcome: Progressing   Problem: Skin Integrity: Goal: Risk for impaired skin integrity will decrease Outcome: Progressing   Wendi Dash, RN

## 2024-11-09 NOTE — Evaluation (Signed)
 " Clinical/Bedside Swallow Evaluation Patient Details  Name: Stacy Valentine MRN: [id number] Date of Birth: May 22, 1926  Today's Date: 11/09/2024 Time: SLP Start Time (ACUTE ONLY): 1030 SLP Stop Time (ACUTE ONLY): 1057 SLP Time Calculation (min) (ACUTE ONLY): 27 min  Past Medical History: History reviewed. No pertinent past medical history. Past Surgical History: History reviewed. No pertinent surgical history. HPI:  Stacy Valentine is a 89 y.o. female who presented to the hospital on 11/05/24 from home (lives with family) secondary to confusion, hallucination. CTH negative for acute intracranial abnormalities, CXR did not show any focal pulmonary opacity. SLP swallow evaluation ordered on 1/14 after PT observed patient to be coughing even with small sips of water. No h/o dysphagia per chart review. Daughter reports they limit her from drinking too much too quickly as that has led to some coughing in the past.PMH: HTN, ILD, DM-2, hypothyroidism, osteoporosis.    Assessment / Plan / Recommendation  Clinical Impression  SLP and daughter both in agreement for short term trial of nectar thick liquids and water via 5cc Provale cup.  If further concerns, daughter can request OP MBS with SLP.  Patient presented with immediate cough response after small cup sips of thin liquids (water) but no overt s/s with small cup sips of nectar thick liquids. Her daughter denies any h/o dysphagia but that she does monitor patient and doesn't allow bottles (water bottles,etc) as patient would tend to drink too much at one time and would have instances of coughing. SLP does not suspect that she has had an acute change in her overall swallow function. SLP discussed options of mitigating aspiration risk with daughter as well as stressing the importance of not making significant changes that would impact her overall PO intake.  SLP provided daughter with Simply Thick nectar starter box.  SLP Visit Diagnosis: Dysphagia,  unspecified (R13.10)    Aspiration Risk  Mild aspiration risk    Diet Recommendation Regular;Nectar-thick liquid;Thin liquid    Liquid Administration via: Cup Medication Administration: Other (Comment) (as tolerated) Compensations: Slow rate;Small sips/bites Postural Changes: Seated upright at 90 degrees    Other Recommendations Oral Care Recommendations: Oral care BID     Swallow Evaluation Recommendations     Assistance Recommended at Discharge    Functional Status Assessment Patient has not had a recent decline in their functional status  Frequency and Duration            Prognosis        Swallow Study   General Date of Onset: 11/08/24 HPI: Stacy Valentine is a 89 y.o. female who presented to the hospital on 11/05/24 from home (lives with family) secondary to confusion, hallucination. CTH negative for acute intracranial abnormalities, CXR did not show any focal pulmonary opacity. SLP swallow evaluation ordered on 1/14 after PT observed patient to be coughing even with small sips of water. No h/o dysphagia per chart review. Daughter reports they limit her from drinking too much too quickly as that has led to some coughing in the past.PMH: HTN, ILD, DM-2, hypothyroidism, osteoporosis. Type of Study: Bedside Swallow Evaluation Previous Swallow Assessment: none found Diet Prior to this Study: Regular;Thin liquids (Level 0) Temperature Spikes Noted: No Respiratory Status: Room air History of Recent Intubation: No Behavior/Cognition: Alert;Cooperative;Pleasant mood Oral Cavity Assessment: Within Functional Limits Oral Care Completed by SLP: No Oral Cavity - Dentition: Adequate natural dentition Vision: Functional for self-feeding Self-Feeding Abilities: Able to feed self Patient Positioning: Upright in bed Baseline Vocal Quality: Normal  Oral/Motor/Sensory Function Overall Oral Motor/Sensory Function: Within functional limits   Ice Chips     Thin Liquid Thin Liquid:  Impaired Presentation: Cup;Self Fed Pharyngeal  Phase Impairments: Cough - Immediate    Nectar Thick Nectar Thick Liquid: Within functional limits Presentation: Cup;Self Fed   Honey Thick     Puree Puree: Not tested   Solid     Solid: Not tested      Norleen IVAR Blase, MA, CCC-SLP Speech Therapy  11/09/2024,11:19 AM    "

## 2024-11-09 NOTE — Discharge Summary (Signed)
 "  Physician Discharge Summary  EDRIE EHRICH FMW:[id number] DOB: 09/20/1926 DOA: 11/04/2024  PCP: Cleotilde Planas, MD  Admit date: 11/04/2024 Discharge date: 11/09/2024  Admitted from: Home Discharge disposition: Home with PT OT  Recommendations at discharge:  For diabetes, continue glipizide  at a lower dose of 5 mg daily which would minimize the risk of hypoglycemia. Continue Januvia at 50 mg daily as before.  Continue to monitor blood sugar level at home and follow-up with primary care provider.  For blood pressure control, continue Toprol , Norvasc  and Lasix  as before.  Reduce lisinopril  to 20 mg at night.  Continue to monitor blood pressure at home Melatonin nightly as needed for sleep Your vitamin B12 level was low and hence you have been started on vitamin B12 supplement. Per speech therapy evaluation, mild risk of aspiration noted.  Nectar thick liquid recommended.   Subjective: Patient was seen and examined this morning. Afebrile, hemodynamically stable, breathing on room air Blood sugar level elevated at 198, creatinine 1.39 Lying on bed.  Slept well last night.  This morning, alert, awake, able to answer questions.  Daughter at bedside. Seen by speech therapist.  Mild aspiration risk noted recommended nectar thick liquid. Patient and daughter both hoping for discharge today.  Brief narrative: Stacy Valentine is a 89 y.o. female with PMH significant for DM2, HTN, HLD, hypothyroidism, osteoporosis. Patient lives at home.  At baseline able to take care of daily activities and did not have any symptoms or signs of dementia. 1/10, patient was brought to the ED by EMS for rapidly progressive confusion, disorientation, hallucination aggressive behavior. Per family, symptoms started around 24 hours prior to presentation. No fever, cough, dysuria, any other localizing symptoms of infection.  In the ED, patient was afebrile, heart rate 60s and 70s, initial blood pressure was elevated  to 179/69, breathing on room air. Initial labs with WC count normal at 7.1, BUN/creatinine 28/1.18, lactic acid 1 Respiratory virus panel unremarkable. Urinalysis with clear straw-colored urine with trace leukocytes, rare bacteria Urine culture sent Chest x-ray unremarkable CT head unremarkable MRI brain did not show any evidence of acute intracranial abnormality, showed mild chronic small vessel ischemic disease.  Admitted to [facility] for further evaluation of encephalopathy.  Hospital course: Acute metabolic encephalopathy  UTI Per family, at baseline, patient has no signs of dementia. Presented with 24 hours of confusion, hallucination with disorientation and aggressive behavior. Multiple possible etiologies were considered CT head/MRI brain without acute abnormality, but showed chronic mild small vessel ischemic disease TSH mildly elevated but T4 normal.  Ammonia, CRP normal, folic acid level normal.  Vitamin B12 on the lower side of normal at 207. Urinalysis with trace leukocytes, rare bacteria, unclear if clearly has UTI. UTI is only attributable cause to altered mental status.  With IV antibiotics, her mental status gradually improved.  She was also given trazodone  and melatonin at night to help sleep. Significantly improved mental status in the last 2 days.  Stable for discharge today. Received 3 days of empiric IV Rocephin  and also day of oral Duricef.  Completed course of antibiotics. I would suggest melatonin 5 mg nightly PRN postdischarge.  Large left mastoid effusion Seen on CT head on admission Clinically, no suspicion of sinusitis.  Low vitamin B12 Vitamin B12 level on the lower side of normal at 207.  Oral replacement started Recent Labs    11/05/24 1638 11/06/24 0655 11/09/24 0712  MCV  --  90.6 92.7  VITAMINB12 207  --   --  FOLATE 12.4  --   --     CKD 5 Creatinine remains at baseline. Repeat labs tomorrow Recent Labs    09/17/24 2025 11/04/24 2312  11/05/24 0544 11/06/24 0655 11/09/24 0712  BUN 29* 28*  --  27* 31*  CREATININE 1.29* 1.18* 1.14* 1.31* 1.39*  CO2 21* 20*  --  18* 21*   Type 2 diabetes mellitus with hyperglycemia A1c 6.2 on 11/05/2024 PTA meds-glipizide  10 mg daily, Januvia 50 mg daily Given her high risk of hypoglycemia with CKD 5, glipizide  was stopped and Januvia was resumed on 1/14. Blood sugar level consistently elevated close to 200. I had a long conversation with patient's daughter at bedside today.  She is concerned with the risk of of persistent hyperglycemia without glipizide .  Since her A1c is well-controlled at 6.2, I think we can continue glipizide  at a lower dose of 5 mg daily which should minimize the risk of hypoglycemia..  Continue Januvia at 50 mg daily as before.  Continue to monitor blood sugar level at home and follow-up with primary care provider.  Recent Labs  Lab 11/08/24 0740 11/08/24 1202 11/08/24 1735 11/08/24 2204 11/09/24 0745  GLUCAP 198* 215* 162* 282* 198*   Essential hypertension Blood pressure widely fluctuating. PTA meds-Toprol  100 mg daily, amlodipine  10 mg daily, Lasix  20 mg daily, lisinopril  20 mg/40 mg -am/pm Currently continued on Toprol  and amlodipine .  Lasix  and lisinopril  are on hold given risk of dehydration at her age. Hemodynamically stable.  Per patient's daughter, she was having significantly elevated blood pressure few months ago and hence lisinopril  was made twice daily.  Based on her current requirement, I would suggest to continue Toprol , Norvasc  and Lasix  as before blood reduce lisinopril  to 20 mg at night.  Continue to monitor blood pressure at home and follow-up with PCP. Continue PRN IV hydralazine    Hyperlipidemia Continue atorvastatin    Hypothyroidism Slightly elevated TSH but normal free T4.   Continue home dose Synthroid   Dysphagia Family is concerned about aspiration.  SLP eval obtained.  Mild risk of aspiration noted.  Nectar thick liquid  recommended.  Impaired mobility Seen by PT. Home health PT recommended PT Follow up Rec: Home Health Pt ([facility])11/08/2024 1600    Goals of care   Code Status: Full Code   Diet:  Diet Order             Diet general           Diet regular Room service appropriate? Yes; Fluid consistency: Thin  Diet effective now                   Nutritional status:  Body mass index is 29.03 kg/m.       Wounds:  -    Discharge Medications:   Allergies as of 11/09/2024       Reactions   Penicillins         Medication List     STOP taking these medications    oxyCODONE  5 MG immediate release tablet Commonly known as: Roxicodone        TAKE these medications    alendronate 70 MG tablet Commonly known as: FOSAMAX Take 70 mg by mouth once a week.   amLODipine  10 MG tablet Commonly known as: NORVASC  Take 10 mg by mouth daily.   atorvastatin  10 MG tablet Commonly known as: LIPITOR Take 10 mg by mouth daily.   clobetasol ointment 0.05 % Commonly known as: TEMOVATE SMARTSIG:Topical Twice a Week PRN  cyanocobalamin  1000 MCG tablet Take 1 tablet (1,000 mcg total) by mouth daily. Start taking on: November 10, 2024   ezetimibe  10 MG tablet Commonly known as: ZETIA  Take 10 mg by mouth daily.   furosemide  20 MG tablet Commonly known as: LASIX  Take 20 mg by mouth daily.   glipiZIDE  5 MG 24 hr tablet Commonly known as: GLUCOTROL  XL Take 1 tablet (5 mg total) by mouth every morning. What changed:  medication strength how much to take   Januvia 50 MG tablet Generic drug: sitaGLIPtin Take 50 mg by mouth daily.   levothyroxine  75 MCG tablet Commonly known as: SYNTHROID  Take 75 mcg by mouth every morning.   lisinopril  20 MG tablet Commonly known as: ZESTRIL  Take 1 tablet (20 mg total) by mouth at bedtime. 40 MG by mouth in the AM and 20 MG by mouth in the PM What changed:  how much to take when to take this   melatonin 3 MG Tabs tablet Take 1  tablet (3 mg total) by mouth at bedtime as needed.   metoprolol  succinate 100 MG 24 hr tablet Commonly known as: TOPROL -XL Take 100 mg by mouth daily.   OneTouch Delica Plus Lancet33G Misc Apply topically every morning.   OneTouch Verio Flex System w/Device Kit USE AS DIRECTED ONCE DAILY IN THE MORNING TO CHECK FASTING GLUCOSE. GOAL < 130   OneTouch Verio test strip Generic drug: glucose blood SMARTSIG:Device Via Meter Every Morning         Follow ups:    Follow-up Information     Cleotilde Planas, MD Follow up.   Specialty: Family Medicine Contact information: [id number] [place] [address] [phone]                 Discharge Instructions:   Discharge Instructions     Call MD for:  difficulty breathing, headache or visual disturbances   Complete by: As directed    Call MD for:  extreme fatigue   Complete by: As directed    Call MD for:  hives   Complete by: As directed    Call MD for:  persistant dizziness or light-headedness   Complete by: As directed    Call MD for:  persistant nausea and vomiting   Complete by: As directed    Call MD for:  severe uncontrolled pain   Complete by: As directed    Call MD for:  temperature >100.4   Complete by: As directed    Diet general   Complete by: As directed    Nectar thick liquid   Discharge instructions   Complete by: As directed    Recommendations at discharge:   For diabetes, continue glipizide  at a lower dose of 5 mg daily which would minimize the risk of hypoglycemia. Continue Januvia at 50 mg daily as before.  Continue to monitor blood sugar level at home and follow-up with primary care provider.   For blood pressure control, continue Toprol , Norvasc  and Lasix  as before.  Reduce lisinopril  to 20 mg at night.  Continue to monitor blood pressure at home  Melatonin nightly as needed for sleep  Your vitamin B12 level was low and hence you have been started on vitamin B12 supplement.  Per  speech therapy evaluation, mild risk of aspiration noted.  Nectar thick liquid recommended.  General discharge instructions: Follow with Primary MD Cleotilde Planas, MD in 7 days  Please request your PCP  to go over your hospital tests, procedures, radiology results at  the follow up. Please get your medicines reviewed and adjusted.  Your PCP may decide to repeat certain labs or tests as needed. Do not drive, operate heavy machinery, perform activities at heights, swimming or participation in water activities or provide baby sitting services if your were admitted for syncope or siezures until you have seen by Primary MD or a Neurologist and advised to do so again. [place]  Controlled Substance Reporting System database was reviewed. Do not drive, operate heavy machinery, perform activities at heights, swim, participate in water activities or provide baby-sitting services while on medications for pain, sleep and mood until your outpatient physician has reevaluated you and advised to do so again.  You are strongly recommended to comply with the dose, frequency and duration of prescribed medications. Activity: As tolerated with Full fall precautions use walker/cane & assistance as needed Avoid using any recreational substances like cigarette, tobacco, alcohol, or non-prescribed drug. If you experience worsening of your admission symptoms, develop shortness of breath, life threatening emergency, suicidal or homicidal thoughts you must seek medical attention immediately by calling 911 or calling your MD immediately  if symptoms less severe. You must read complete instructions/literature along with all the possible adverse reactions/side effects for all the medicines you take and that have been prescribed to you. Take any new medicine only after you have completely understood and accepted all the possible adverse reactions/side effects.  Wear Seat belts while driving. You were cared for by a hospitalist  during your hospital stay. If you have any questions about your discharge medications or the care you received while you were in the hospital after you are discharged, you can call the unit and ask to speak with the hospitalist or the covering physician. Once you are discharged, your primary care physician will handle any further medical issues. Please note that NO REFILLS for any discharge medications will be authorized once you are discharged, as it is imperative that you return to your primary care physician (or establish a relationship with a primary care physician if you do not have one).   Increase activity slowly   Complete by: As directed        Discharge Exam:   Vitals:   11/08/24 1426 11/08/24 2221 11/09/24 0500 11/09/24 0654  BP: (!) 167/65 (!) 152/62    Pulse: 75 68  74  Resp: 14 16  18   Temp: 98.1 F (36.7 C) 98 F (36.7 C)  98.2 F (36.8 C)  TempSrc: Oral Oral  Oral  SpO2: 96% 98%  97%  Weight:   63 kg   Height:        Body mass index is 29.03 kg/m.  General exam: Pleasant, elderly Caucasian female.  Not in physical distress Skin: No rashes, lesions or ulcers. HEENT: Atraumatic, normocephalic, no obvious bleeding Lungs: Clear to auscultation bilaterally,  CVS: S1, S2, no murmur,   GI/Abd: Soft, nontender, nondistended, bowel sound present,   CNS: Alert awake, oriented x 3 Extremities: No pedal edema, no calf tenderness.   The results of significant diagnostics from this hospitalization (including imaging, microbiology, ancillary and laboratory) are listed below for reference.    Procedures and Diagnostic Studies:   MR BRAIN WO CONTRAST Result Date: 11/05/2024 EXAM: MRI BRAIN WITHOUT CONTRAST 11/05/2024 02:28:16 PM TECHNIQUE: Multiplanar multisequence MRI of the head/brain was performed without the administration of intravenous contrast. COMPARISON: Head CT 11/05/2024. CLINICAL HISTORY: Altered mental status. FINDINGS: LIMITATIONS: The examination is motion  degraded, with multiple sequences being severely degraded. BRAIN  AND VENTRICLES: Diffusion weighted imaging is only mildly motion degraded, and there is no evidence of an acute infarct. No gross intracranial hemorrhage, mass/mass effect, hydrocephalus, or extra-axial fluid collection is identified within study limitations. Cerebral white matter T2 hyperintensities are nonspecific but compatible with mild chronic small vessel ischemic disease. A small chronic infarct is suspected in the right cerebellar hemisphere. There is mild cerebral atrophy. Major intracranial vascular flow voids are preserved. ORBITS: Bilateral cataract extraction. SINUSES AND MASTOIDS: Large left mastoid effusion. Grossly clear paranasal sinuses. BONES AND SOFT TISSUES: Normal marrow signal. No significant soft tissue abnormality. IMPRESSION: 1. Motion degraded examination without evidence of an acute intracranial abnormality. 2. Mild chronic small vessel ischemic disease. 3. Large left mastoid effusion. Electronically signed by: Dasie Hamburg MD MD 11/05/2024 03:29 PM EST RP Workstation: [id number]   CT HEAD WO CONTRAST ( ) Result Date: 11/05/2024 EXAM: CT HEAD WITHOUT CONTRAST 11/05/2024 12:24:31 AM TECHNIQUE: CT of the head was performed without the administration of intravenous contrast. Automated exposure control, iterative reconstruction, and/or weight based adjustment of the mA/kV was utilized to reduce the radiation dose to as low as reasonably achievable. COMPARISON: 09/17/2024 CLINICAL HISTORY: Mental status change, unknown cause. FINDINGS: BRAIN AND VENTRICLES: Global cortical atrophy. Subcortical and periventricular small vessel ischemic changes. Intracranial atherosclerosis. No acute hemorrhage. No evidence of acute infarct. No hydrocephalus. No extra-axial collection. No mass effect or midline shift. ORBITS: No acute abnormality. SINUSES: No acute abnormality. SOFT TISSUES AND SKULL: No acute soft tissue abnormality. No skull  fracture. IMPRESSION: 1. No acute intracranial abnormality. Electronically signed by: Pinkie Pebbles MD MD 11/05/2024 12:28 AM EST RP Workstation: [id number]   DG Chest Port 1 View Result Date: 11/04/2024 EXAM: 1 VIEW(S) XRAY OF THE CHEST 11/04/2024 11:06:00 PM COMPARISON: None available. CLINICAL HISTORY: confusion, ?aspiration last night FINDINGS: LUNGS AND PLEURA: No focal pulmonary opacity. No pleural effusion. No pneumothorax. HEART AND MEDIASTINUM: Aortic arch calcifications. No acute abnormality of the cardiac and mediastinal silhouettes. BONES AND SOFT TISSUES: Surgical clips in left axilla. Degenerative changes of bilateral AC joints. Multilevel degenerative changes of thoracic spine. IMPRESSION: 1. No acute cardiopulmonary process. 2. Surgical clips in the left axilla. 3. Aortic arch atherosclerotic calcifications. Electronically signed by: Dorethia Molt MD MD 11/04/2024 11:16 PM EST RP Workstation: [id number]     Labs:   Basic Metabolic Panel: Recent Labs  Lab 11/04/24 2312 11/05/24 0544 11/05/24 1638 11/06/24 0655 11/09/24 0712  NA 138  --   --  141 145  K 4.0  --   --  4.1 3.7  CL 107  --   --  108 116*  CO2 20*  --   --  18* 21*  GLUCOSE 126*  --   --  270* 206*  BUN 28*  --   --  27* 31*  CREATININE 1.18* 1.14*  --  1.31* 1.39*  CALCIUM  10.1  --   --  10.0 9.4  MG  --   --  2.3  --   --   PHOS  --   --  3.1  --   --    GFR Estimated Creatinine Clearance: 18.6 mL/min (A) (by C-G formula based on SCr of 1.39 mg/dL (H)). Liver Function Tests: Recent Labs  Lab 11/04/24 2312  AST 21  ALT <5  ALKPHOS 60  BILITOT 0.9  PROT 6.4*  ALBUMIN 3.5   No results for input(s): LIPASE, AMYLASE in the last 168 hours. Recent Labs  Lab 11/05/24 1638  AMMONIA 21  Coagulation profile Recent Labs  Lab 11/05/24 1638  INR 1.0    CBC: Recent Labs  Lab 11/04/24 2312 11/05/24 0544 11/06/24 0655 11/09/24 0712  WBC 7.1 10.5 8.8 7.9  NEUTROABS 4.4  --   --  5.5   HGB 12.1 11.8* 11.5* 11.4*  HCT 35.5* 34.8* 32.9* 34.1*  MCV 92.4 92.3 90.6 92.7  PLT 227 234 222 211   Cardiac Enzymes: No results for input(s): CKTOTAL, CKMB, CKMBINDEX, TROPONINI in the last 168 hours. BNP: Invalid input(s): POCBNP CBG: Recent Labs  Lab 11/08/24 0740 11/08/24 1202 11/08/24 1735 11/08/24 2204 11/09/24 0745  GLUCAP 198* 215* 162* 282* 198*   D-Dimer No results for input(s): DDIMER in the last 72 hours. Hgb A1c No results for input(s): HGBA1C in the last 72 hours. Lipid Profile No results for input(s): CHOL, HDL, LDLCALC, TRIG, CHOLHDL, LDLDIRECT in the last 72 hours. Thyroid  function studies No results for input(s): TSH, T4TOTAL, T3FREE, THYROIDAB in the last 72 hours.  Invalid input(s): FREET3 Anemia work up No results for input(s): VITAMINB12, FOLATE, FERRITIN, TIBC, IRON, RETICCTPCT in the last 72 hours. Microbiology Recent Results (from the past 240 hours)  Urine Culture (for pregnant, neutropenic or urologic patients or patients with an indwelling urinary catheter)     Status: Abnormal   Collection Time: 11/04/24  8:17 AM   Specimen: Urine, Clean Catch  Result Value Ref Range Status   Specimen Description URINE, CLEAN CATCH  Final   Special Requests NONE  Final   Culture (A)  Final    20,000 COLONIES/mL GROUP B STREP(S.AGALACTIAE)ISOLATED TESTING AGAINST S. AGALACTIAE NOT ROUTINELY PERFORMED DUE TO PREDICTABILITY OF AMP/PEN/VAN SUSCEPTIBILITY.    Report Status 11/06/2024 FINAL  Final  Resp panel by RT-PCR (RSV, Flu A&B, Covid) Anterior Nasal Swab     Status: None   Collection Time: 11/04/24 11:30 PM   Specimen: Anterior Nasal Swab  Result Value Ref Range Status   SARS Coronavirus 2 by RT PCR NEGATIVE NEGATIVE Final    Comment: (NOTE) SARS-CoV-2 target nucleic acids are NOT DETECTED.  The SARS-CoV-2 RNA is generally detectable in upper respiratory specimens during the acute phase of infection.  The lowest concentration of SARS-CoV-2 viral copies this assay can detect is 138 copies/mL. A negative result does not preclude SARS-Cov-2 infection and should not be used as the sole basis for treatment or other patient management decisions. A negative result may occur with  improper specimen collection/handling, submission of specimen other than nasopharyngeal swab, presence of viral mutation(s) within the areas targeted by this assay, and inadequate number of viral copies(<138 copies/mL). A negative result must be combined with clinical observations, patient history, and epidemiological information. The expected result is Negative.  Fact Sheet for Patients:  bloggercourse.com  Fact Sheet for Healthcare Providers:  seriousbroker.it  This test is no t yet approved or cleared by the [place]  FDA and  has been authorized for detection and/or diagnosis of SARS-CoV-2 by FDA under an Emergency Use Authorization (EUA). This EUA will remain  in effect (meaning this test can be used) for the duration of the COVID-19 declaration under Section 564(b)(1) of the Act, 21 U.S.C.section 360bbb-3(b)(1), unless the authorization is terminated  or revoked sooner.       Influenza A by PCR NEGATIVE NEGATIVE Final   Influenza B by PCR NEGATIVE NEGATIVE Final    Comment: (NOTE) The Xpert Xpress SARS-CoV-2/FLU/RSV plus assay is intended as an aid in the diagnosis of influenza from Nasopharyngeal swab specimens and should not  be used as a sole basis for treatment. Nasal washings and aspirates are unacceptable for Xpert Xpress SARS-CoV-2/FLU/RSV testing.  Fact Sheet for Patients: bloggercourse.com  Fact Sheet for Healthcare Providers: seriousbroker.it  This test is not yet approved or cleared by the [place]  FDA and has been authorized for detection and/or diagnosis of SARS-CoV-2 by FDA  under an Emergency Use Authorization (EUA). This EUA will remain in effect (meaning this test can be used) for the duration of the COVID-19 declaration under Section 564(b)(1) of the Act, 21 U.S.C. section 360bbb-3(b)(1), unless the authorization is terminated or revoked.     Resp Syncytial Virus by PCR NEGATIVE NEGATIVE Final    Comment: (NOTE) Fact Sheet for Patients: bloggercourse.com  Fact Sheet for Healthcare Providers: seriousbroker.it  This test is not yet approved or cleared by the [place]  FDA and has been authorized for detection and/or diagnosis of SARS-CoV-2 by FDA under an Emergency Use Authorization (EUA). This EUA will remain in effect (meaning this test can be used) for the duration of the COVID-19 declaration under Section 564(b)(1) of the Act, 21 U.S.C. section 360bbb-3(b)(1), unless the authorization is terminated or revoked.  Performed at [facility], [address], [place], [place] [address]     Time coordinating discharge: 45 minutes  Signed: Chapman Conni Knighton  [facility] 11/09/2024, 11:42 AM   "

## 2024-11-09 NOTE — Plan of Care (Signed)
" °  Problem: Education: Goal: Ability to describe self-care measures that may prevent or decrease complications (Diabetes Survival Skills Education) will improve Outcome: Completed/Met Goal: Individualized Educational Video(s) Outcome: Completed/Met   Problem: Coping: Goal: Ability to adjust to condition or change in health will improve Outcome: Completed/Met   Problem: Fluid Volume: Goal: Ability to maintain a balanced intake and output will improve Outcome: Completed/Met   Problem: Health Behavior/Discharge Planning: Goal: Ability to identify and utilize available resources and services will improve Outcome: Completed/Met Goal: Ability to manage health-related needs will improve Outcome: Completed/Met   Problem: Metabolic: Goal: Ability to maintain appropriate glucose levels will improve Outcome: Completed/Met   "

## 2024-11-30 ENCOUNTER — Ambulatory Visit: Admitting: Podiatry

## 2024-11-30 ENCOUNTER — Encounter: Payer: Self-pay | Admitting: Podiatry

## 2024-11-30 DIAGNOSIS — E119 Type 2 diabetes mellitus without complications: Secondary | ICD-10-CM

## 2024-11-30 DIAGNOSIS — B351 Tinea unguium: Secondary | ICD-10-CM

## 2024-11-30 NOTE — Progress Notes (Addendum)
 This patient returns to my office for at risk foot care.  This patient requires this care by a professional since this patient will be at risk due to having diabetes.   This patient is unable to cut nails herself since the patient cannot reach her nails.These nails are painful walking and wearing shoes. She presents to the office with a female caregiver. This patient presents for at risk foot care today.  General Appearance  Alert, conversant and in no acute stress.  Vascular  Dorsalis pedis  pulses are  weakly palpable  bilaterally. Posterior tibial pulses are not palpable. Capillary return is within normal limits  bilaterally. Temperature is within normal limits  bilaterally.  Swelling feet  B/L.  Absent digital hair.  Neurologic  Senn-Weinstein monofilament wire test within normal limits  bilaterally. Muscle power within normal limits bilaterally.  Nails Thick disfigured discolored nails with subungual debris  from hallux to fifth toes bilaterally. No evidence of bacterial infection or drainage bilaterally.  Orthopedic  No limitations of motion  feet .  No crepitus or effusions noted.  No bony pathology or digital deformities noted.  Skin  normotropic skin with no porokeratosis noted bilaterally.  No signs of infections or ulcers noted.     Onychomycosis  Pain in right toes  Pain in left toes  Consent was obtained for treatment procedures.   Mechanical debridement of nails 1-5  bilaterally performed with a nail nipper.  Filed with dremel without incident. Upon transferring from her wheelchair to treatment chair she lost her balance and fell on the floor.  Her daughter and myself helped her into the treatment chair.  She denied any pain or discomfort from the fall.  I made sure she was helped back into the wheelchair after treatment.   Return office visit  3 months                    Told patient to return for periodic foot care and evaluation due to potential at risk complications.   Cordella Bold DPM

## 2025-02-26 ENCOUNTER — Ambulatory Visit: Admitting: Podiatry
